# Patient Record
Sex: Female | Born: 1950 | Race: White | Hispanic: No | Marital: Married | Smoking: Never smoker
Health system: Southern US, Community
[De-identification: ages and names within clinical notes are randomized; demographics above are authoritative.]

## PROBLEM LIST (undated history)

## (undated) DIAGNOSIS — I2510 Atherosclerotic heart disease of native coronary artery without angina pectoris: Secondary | ICD-10-CM

## (undated) DIAGNOSIS — Q2112 Patent foramen ovale: Secondary | ICD-10-CM

## (undated) DIAGNOSIS — I340 Nonrheumatic mitral (valve) insufficiency: Secondary | ICD-10-CM

## (undated) DIAGNOSIS — M810 Age-related osteoporosis without current pathological fracture: Secondary | ICD-10-CM

## (undated) DIAGNOSIS — Q211 Atrial septal defect: Secondary | ICD-10-CM

## (undated) DIAGNOSIS — I10 Essential (primary) hypertension: Secondary | ICD-10-CM

## (undated) DIAGNOSIS — C4490 Unspecified malignant neoplasm of skin, unspecified: Secondary | ICD-10-CM

## (undated) HISTORY — DX: Age-related osteoporosis without current pathological fracture: M81.0

## (undated) HISTORY — DX: Atherosclerotic heart disease of native coronary artery without angina pectoris: I25.10

## (undated) HISTORY — PX: OTHER SURGICAL HISTORY: SHX169

## (undated) HISTORY — DX: Essential (primary) hypertension: I10

## (undated) HISTORY — DX: Patent foramen ovale: Q21.12

## (undated) HISTORY — DX: Nonrheumatic mitral (valve) insufficiency: I34.0

## (undated) HISTORY — DX: Atrial septal defect: Q21.1

---

## 1998-08-08 ENCOUNTER — Other Ambulatory Visit: Admission: RE | Admit: 1998-08-08 | Discharge: 1998-08-08 | Payer: Self-pay | Admitting: *Deleted

## 1999-08-29 ENCOUNTER — Other Ambulatory Visit: Admission: RE | Admit: 1999-08-29 | Discharge: 1999-08-29 | Payer: Self-pay | Admitting: *Deleted

## 1999-09-10 ENCOUNTER — Encounter: Payer: Self-pay | Admitting: *Deleted

## 1999-09-10 ENCOUNTER — Encounter: Admission: RE | Admit: 1999-09-10 | Discharge: 1999-09-10 | Payer: Self-pay | Admitting: *Deleted

## 2000-02-18 ENCOUNTER — Other Ambulatory Visit: Admission: RE | Admit: 2000-02-18 | Discharge: 2000-02-18 | Payer: Self-pay | Admitting: *Deleted

## 2000-02-19 ENCOUNTER — Other Ambulatory Visit: Admission: RE | Admit: 2000-02-19 | Discharge: 2000-02-19 | Payer: Self-pay | Admitting: *Deleted

## 2000-02-19 ENCOUNTER — Encounter (INDEPENDENT_AMBULATORY_CARE_PROVIDER_SITE_OTHER): Payer: Self-pay

## 2000-08-24 DIAGNOSIS — C4490 Unspecified malignant neoplasm of skin, unspecified: Secondary | ICD-10-CM

## 2000-08-24 HISTORY — DX: Unspecified malignant neoplasm of skin, unspecified: C44.90

## 2000-09-06 ENCOUNTER — Other Ambulatory Visit: Admission: RE | Admit: 2000-09-06 | Discharge: 2000-09-06 | Payer: Self-pay | Admitting: *Deleted

## 2000-09-10 ENCOUNTER — Encounter: Payer: Self-pay | Admitting: *Deleted

## 2000-09-10 ENCOUNTER — Encounter: Admission: RE | Admit: 2000-09-10 | Discharge: 2000-09-10 | Payer: Self-pay | Admitting: *Deleted

## 2001-02-02 ENCOUNTER — Ambulatory Visit (HOSPITAL_BASED_OUTPATIENT_CLINIC_OR_DEPARTMENT_OTHER): Admission: RE | Admit: 2001-02-02 | Discharge: 2001-02-02 | Payer: Self-pay | Admitting: Plastic Surgery

## 2001-03-01 ENCOUNTER — Ambulatory Visit (HOSPITAL_COMMUNITY): Admission: RE | Admit: 2001-03-01 | Discharge: 2001-03-01 | Payer: Self-pay | Admitting: Gastroenterology

## 2001-09-13 ENCOUNTER — Encounter: Payer: Self-pay | Admitting: *Deleted

## 2001-09-13 ENCOUNTER — Encounter: Admission: RE | Admit: 2001-09-13 | Discharge: 2001-09-13 | Payer: Self-pay | Admitting: *Deleted

## 2001-09-23 ENCOUNTER — Other Ambulatory Visit: Admission: RE | Admit: 2001-09-23 | Discharge: 2001-09-23 | Payer: Self-pay | Admitting: *Deleted

## 2002-09-15 ENCOUNTER — Encounter: Payer: Self-pay | Admitting: *Deleted

## 2002-09-15 ENCOUNTER — Encounter: Admission: RE | Admit: 2002-09-15 | Discharge: 2002-09-15 | Payer: Self-pay | Admitting: *Deleted

## 2002-11-14 ENCOUNTER — Other Ambulatory Visit: Admission: RE | Admit: 2002-11-14 | Discharge: 2002-11-14 | Payer: Self-pay | Admitting: Obstetrics and Gynecology

## 2003-01-03 ENCOUNTER — Ambulatory Visit (HOSPITAL_COMMUNITY): Admission: RE | Admit: 2003-01-03 | Discharge: 2003-01-03 | Payer: Self-pay | Admitting: Gastroenterology

## 2003-09-18 ENCOUNTER — Encounter: Admission: RE | Admit: 2003-09-18 | Discharge: 2003-09-18 | Payer: Self-pay | Admitting: Obstetrics and Gynecology

## 2003-11-21 ENCOUNTER — Other Ambulatory Visit: Admission: RE | Admit: 2003-11-21 | Discharge: 2003-11-21 | Payer: Self-pay | Admitting: Obstetrics and Gynecology

## 2003-12-11 ENCOUNTER — Encounter: Admission: RE | Admit: 2003-12-11 | Discharge: 2003-12-11 | Payer: Self-pay | Admitting: Obstetrics and Gynecology

## 2004-01-07 ENCOUNTER — Encounter: Admission: RE | Admit: 2004-01-07 | Discharge: 2004-01-07 | Payer: Self-pay | Admitting: Internal Medicine

## 2005-02-02 ENCOUNTER — Encounter: Admission: RE | Admit: 2005-02-02 | Discharge: 2005-02-02 | Payer: Self-pay | Admitting: Obstetrics and Gynecology

## 2005-05-10 ENCOUNTER — Emergency Department (HOSPITAL_COMMUNITY): Admission: EM | Admit: 2005-05-10 | Discharge: 2005-05-10 | Payer: Self-pay | Admitting: Emergency Medicine

## 2005-07-10 ENCOUNTER — Encounter: Admission: RE | Admit: 2005-07-10 | Discharge: 2005-07-10 | Payer: Self-pay | Admitting: Obstetrics and Gynecology

## 2006-09-22 ENCOUNTER — Encounter: Admission: RE | Admit: 2006-09-22 | Discharge: 2006-09-22 | Payer: Self-pay | Admitting: Internal Medicine

## 2006-09-29 ENCOUNTER — Encounter: Admission: RE | Admit: 2006-09-29 | Discharge: 2006-09-29 | Payer: Self-pay | Admitting: Obstetrics and Gynecology

## 2007-03-01 ENCOUNTER — Encounter: Admission: RE | Admit: 2007-03-01 | Discharge: 2007-03-01 | Payer: Self-pay | Admitting: Obstetrics and Gynecology

## 2007-04-12 ENCOUNTER — Emergency Department (HOSPITAL_COMMUNITY): Admission: EM | Admit: 2007-04-12 | Discharge: 2007-04-12 | Payer: Self-pay | Admitting: Emergency Medicine

## 2007-12-27 ENCOUNTER — Encounter: Admission: RE | Admit: 2007-12-27 | Discharge: 2007-12-27 | Payer: Self-pay | Admitting: Obstetrics and Gynecology

## 2008-05-10 ENCOUNTER — Encounter: Admission: RE | Admit: 2008-05-10 | Discharge: 2008-05-10 | Payer: Self-pay | Admitting: Obstetrics and Gynecology

## 2009-02-05 ENCOUNTER — Encounter: Admission: RE | Admit: 2009-02-05 | Discharge: 2009-02-05 | Payer: Self-pay | Admitting: Obstetrics and Gynecology

## 2010-09-13 ENCOUNTER — Emergency Department (HOSPITAL_COMMUNITY)
Admission: EM | Admit: 2010-09-13 | Discharge: 2010-09-13 | Payer: Self-pay | Source: Home / Self Care | Admitting: Emergency Medicine

## 2010-09-16 LAB — CBC
HCT: 35.4 % — ABNORMAL LOW (ref 36.0–46.0)
Hemoglobin: 12.1 g/dL (ref 12.0–15.0)
MCH: 30.9 pg (ref 26.0–34.0)
MCHC: 34.2 g/dL (ref 30.0–36.0)
MCV: 90.3 fL (ref 78.0–100.0)
Platelets: 247 10*3/uL (ref 150–400)
RDW: 13.2 % (ref 11.5–15.5)

## 2010-09-16 LAB — COMPREHENSIVE METABOLIC PANEL
ALT: 17 U/L (ref 0–35)
Albumin: 3.6 g/dL (ref 3.5–5.2)
BUN: 19 mg/dL (ref 6–23)
GFR calc Af Amer: >60 mL/min (ref >60)
GFR calc non Af Amer: >60 mL/min (ref >60)
Potassium: 3.8 mEq/L (ref 3.5–5.1)
Sodium: 136 mEq/L (ref 135–145)
Total Protein: 7 g/dL (ref 6.0–8.3)

## 2010-09-16 LAB — URINALYSIS, ROUTINE W REFLEX MICROSCOPIC
Ketones, ur: NEGATIVE mg/dL
Nitrite: NEGATIVE
Protein, ur: NEGATIVE mg/dL
Urine Glucose, Fasting: NEGATIVE mg/dL

## 2010-09-16 LAB — DIFFERENTIAL
Eosinophils Relative: 1 % (ref 0–5)
Lymphocytes Relative: 32 % (ref 12–46)
Neutro Abs: 2.7 10*3/uL (ref 1.7–7.7)
Neutrophils Relative %: 59 % (ref 43–77)

## 2011-01-09 NOTE — Op Note (Signed)
[place]. [facility]  Patient:    Patricia [name], Patricia [name]                        MRN: [id number] Proc. Date: 02/02/01 Adm. Date:  [id number] Attending:  Loura Halt Ii CC:         Hope M. Danella Deis, M.D.   Operative Report  PREOPERATIVE DIAGNOSIS:  A 9 mm basal cell carcinoma, left lateral neck.  POSTOPERATIVE DIAGNOSIS:  A 9 mm basal cell carcinoma, left lateral neck.  OPERATION/PROCEDURE:  Excision basal cell carcinoma, left lateral neck.  SURGEON:  Alfredia Ferguson, M.D.  ANESTHESIA:  2% Xylocaine, 1:100,000 epinephrine.  INDICATIONS FOR SURGERY:  This is a 60 year old woman with a biopsy-proven basal cell carcinoma of the left lateral neck.  She has a 9 mm residual rea of scar.  The previous margins were not clear.  The patient comes in today for excision of the scar and clearing of the margins.  She understands the risk of unsightly scarring.  She understands the risk of positive margins.  In spite of that, she wishes to proceed with the operation.  DESCRIPTION OF PROCEDURE:  Skin marks were placed in an elliptical fashion around the lesion with 2 mm margins.  Local anesthesia was infiltrated and the area was prepped and draped in the sterile fashion.  After waiting approximately seven minutes, an elliptical excision of the lesion down to the level of the subcutaneous tissue was carried out.  The lesion was passed off to pathology.  Wound edges were undermined for a distance of 2-3 mm in all directions.  Hemostasis was accomplished using cautery.  The wound was closed by approximating the dermis using interrupted 5-0 Vicryl suture.  Skin was united using a running 6-0 nylon.  Light dressing was applied.  The patient was discharged to home in satisfactory condition. DD:  02/02/01 TD:  02/02/01 Job: [id number]

## 2011-01-09 NOTE — Op Note (Signed)
[name], [name]                           ACCOUNT NO.:  [id number]   MEDICAL RECORD NO.:  [id number]                   PATIENT TYPE:  AMB   LOCATION:  ENDO                                 FACILITY:  [facility]   PHYSICIAN:  Anselmo Rod, M.D.               DATE OF BIRTH:  1951/05/27   DATE OF PROCEDURE:  01/03/2003  DATE OF DISCHARGE:                                 OPERATIVE REPORT   PROCEDURE PERFORMED:  Colonoscopy.   ENDOSCOPIST:  Charna Elizabeth, M.D.   INSTRUMENT USED:  Olympus video colonoscope.   INDICATIONS FOR PROCEDURE:  Change in bowel habits with severe rectal  pressure in a 60 year old white female with history of mitral valve  prolapse.  Rule out colonic polyps, masses, etc.   PREPROCEDURE PREPARATION:  Informed consent was procured from the patient.  The patient was fasted for eight hours prior to the procedure and prepped  with a bottle of magnesium citrate and a gallon of GoLYTELY the night prior  to the procedure.  The patient was given Cipro 400 mg intravenous for a  history of mitral valve prolapse prophylaxis during colonoscopy.   PREPROCEDURE PHYSICAL:  The patient had stable vital signs.  Neck supple.  Chest clear to auscultation.  S1 and S2 regular.  Abdomen soft with normal  bowel sounds.   DESCRIPTION OF PROCEDURE:  The patient was placed in left lateral decubitus  position and sedated with 70 mg of Demerol and 7 mg of Versed intravenously.  Once the patient was adequately sedated and maintained on low flow oxygen  and continuous cardiac monitoring, the Olympus video colonoscope was  advanced from the rectum to the cecum and terminal ileum without difficulty.  No masses, polyps, erosions, ulcerations or diverticula were seen.  The  terminal ileum appeared healthy.  The appendicular orifice and ileocecal  valve were clearly visualized and photographed.  Retroflexion in the rectum  revealed no acute abnormalities.   IMPRESSION:  Normal colonoscopy to  the terminal ileum.                  RECOMMENDATIONS:  1. A high fiber diet with liberal fluid intake has been advised.  2. Repeat colorectal cancer screening is recommended in the next five years     unless the patient develops any abnormal symptoms in the interim.  3. Outpatient follow-up in the next two weeks or earlier if need be.                                              Anselmo Rod, M.D.  JNM/MEDQ  D:  01/03/2003  T:  01/04/2003  Job:  [id number]  cc:   Ike Bene, M.D.  [address]  [place]  [place] [address]  Fax: [phone]

## 2011-01-09 NOTE — Procedures (Signed)
[place]. [facility]  Patient:    Patricia [name], Patricia [name]                        MRN: [id number] Proc. Date: 03/01/01 Adm. Date:  [id number] Disc. Date: [id number] Attending:  Loura Halt Ii                           Procedure Report  DATE OF BIRTH:  09-Jun-1951  REFERRING PHYSICIAN:  Jenel Lucks, M.D.  PROCEDURE PERFORMED:  Colonoscopy.  ENDOSCOPIST:  Anselmo Rod, M.D.  INSTRUMENT USED:  Olympus pediatric video colonoscope.  INDICATIONS FOR PROCEDURE:  Trace guaiac positive stools in a 60 year old white female rule out colonic polyps, masses, hemorrhoids, etc.  PREPROCEDURE PREPARATION:  Informed consent was procured from the patient. The patient was fasted for eight hours prior to the procedure and prepped with a bottle of magnesium citrate and a gallon of NuLytely the night prior to the procedure.  PREPROCEDURE PHYSICAL:  The patient had stable vital signs.  Neck supple. Chest clear to auscultation.  S1, S2 regular.  Abdomen soft with normal abdominal bowel sounds.  DESCRIPTION OF PROCEDURE:  The patient was placed in the left lateral decubitus position and sedated with 50 mg of Demerol and 6.5 mg of Versed intravenously.  Once the patient was adequately sedated and maintained on low-flow oxygen and continuous cardiac monitoring, the Olympus video colonoscope was advanced from the rectum to the cecum without difficulty.  The entire colonic mucosa appeared healthy with normal vascular pattern, no erosions, ulcerations, masses or polyps were seen.  The appendicular orifice and the ileocecal valve were clearly visualized and photographed and showed no evidence of diverticulosis.  IMPRESSION:  Healthy-appearing colon.  RECOMMENDATIONS:  Repeat guaiacs on outpatient basis.  Further recommendations thereafter.DD:  03/01/01 TD:  03/01/01 Job: [id number]

## 2011-06-05 LAB — LIPASE, BLOOD: Lipase: 56

## 2011-06-05 LAB — URINALYSIS, ROUTINE W REFLEX MICROSCOPIC
Hgb urine dipstick: NEGATIVE
Ketones, ur: NEGATIVE
Specific Gravity, Urine: 1.008
Urobilinogen, UA: 0.2
pH: 8

## 2011-06-05 LAB — DIFFERENTIAL
Basophils Absolute: 0
Basophils Relative: 1
Eosinophils Relative: 7 — ABNORMAL HIGH
Monocytes Absolute: 0.5
Monocytes Relative: 10

## 2011-06-05 LAB — CBC
Hemoglobin: 13.2
MCHC: 34
MCV: 92
RBC: 4.21

## 2011-06-05 LAB — I-STAT 8, (EC8 V) (CONVERTED LAB)
Bicarbonate: 27 — ABNORMAL HIGH
HCT: 43
Hemoglobin: 14.6
Potassium: 3.7
Sodium: 139
pCO2, Ven: 32.2 — ABNORMAL LOW
pH, Ven: 7.531 — ABNORMAL HIGH

## 2011-06-05 LAB — HEPATIC FUNCTION PANEL
Alkaline Phosphatase: 62
Total Bilirubin: 0.7

## 2011-06-05 LAB — POCT CARDIAC MARKERS: Operator id: 270651

## 2011-07-27 ENCOUNTER — Other Ambulatory Visit: Payer: Self-pay | Admitting: Obstetrics and Gynecology

## 2011-07-27 DIAGNOSIS — R928 Other abnormal and inconclusive findings on diagnostic imaging of breast: Secondary | ICD-10-CM

## 2011-08-10 ENCOUNTER — Other Ambulatory Visit: Payer: Self-pay | Admitting: Obstetrics and Gynecology

## 2011-08-10 ENCOUNTER — Ambulatory Visit
Admission: RE | Admit: 2011-08-10 | Discharge: 2011-08-10 | Disposition: A | Discharge status: Home / Self Care | Payer: BC Managed Care – PPO | Source: Ambulatory Visit | Attending: Obstetrics and Gynecology | Admitting: Obstetrics and Gynecology

## 2011-08-10 DIAGNOSIS — R928 Other abnormal and inconclusive findings on diagnostic imaging of breast: Secondary | ICD-10-CM

## 2013-08-31 ENCOUNTER — Encounter: Payer: Self-pay | Admitting: General Surgery

## 2013-08-31 DIAGNOSIS — I341 Nonrheumatic mitral (valve) prolapse: Secondary | ICD-10-CM

## 2013-08-31 DIAGNOSIS — I493 Ventricular premature depolarization: Secondary | ICD-10-CM | POA: Insufficient documentation

## 2013-08-31 DIAGNOSIS — R002 Palpitations: Secondary | ICD-10-CM

## 2013-09-21 ENCOUNTER — Ambulatory Visit (INDEPENDENT_AMBULATORY_CARE_PROVIDER_SITE_OTHER): Payer: BC Managed Care – PPO | Admitting: Cardiology

## 2013-09-21 ENCOUNTER — Encounter: Payer: Self-pay | Admitting: Cardiology

## 2013-09-21 ENCOUNTER — Encounter (INDEPENDENT_AMBULATORY_CARE_PROVIDER_SITE_OTHER): Payer: Self-pay

## 2013-09-21 VITALS — BP 126/88 | HR 61 | Ht 67.0 in | Wt 136.4 lb

## 2013-09-21 DIAGNOSIS — I341 Nonrheumatic mitral (valve) prolapse: Secondary | ICD-10-CM

## 2013-09-21 DIAGNOSIS — I059 Rheumatic mitral valve disease, unspecified: Secondary | ICD-10-CM

## 2013-09-21 DIAGNOSIS — R002 Palpitations: Secondary | ICD-10-CM

## 2013-09-21 DIAGNOSIS — I4949 Other premature depolarization: Secondary | ICD-10-CM

## 2013-09-21 DIAGNOSIS — I493 Ventricular premature depolarization: Secondary | ICD-10-CM

## 2013-09-21 NOTE — Progress Notes (Signed)
  [place], [place] [place], [place]  [address] Phone: [phone] Fax:  [phone]  Date:  09/21/2013   ID:  Kimiah Hibner, DOB [birth year], MRN [id number]  PCP:  Kandice Hams, MD  Cardiologist:  Fransico Him, MD  [place]   History of Present Illness: Patricia [name] is a 63 y.o. female with a history of PFO, MVP and PVC's who presents back today for followup.  She is doing well.  She denies any chest pain, SOB, DOE, LE edema, dizziness, or syncope.  She occasionally has some palpitations but they are significantly improved from when I saw her last.   Wt Readings from Last 3 Encounters:  09/21/13 136 lb 6.4 oz (61.871 kg)     Past Medical History  Diagnosis Date  . Patent foramen ovale   . History of mitral valve prolapse     No MVP noted on echo 2011  . Osteoporosis     tx w fosomax X5 years and boniva X2 currently not on treatment  . Cancer 2002    skin cancer basal cell on neck    Current Outpatient Prescriptions  Medication Sig Dispense Refill  . Biotin 5 MG CAPS Take 1 capsule by mouth daily.      . Calcium Carbonate-Vitamin D (CALCIUM 600+D) 600-400 MG-UNIT per tablet Take 1 tablet by mouth daily.      . cholecalciferol (VITAMIN D) 1000 UNITS tablet Take 1,000 Units by mouth daily.      . Cyanocobalamin (VITAMIN B-12 CR PO) Take 50 mcg by mouth daily.      . Multiple Vitamin (MULTIVITAMIN) tablet Take 1 tablet by mouth daily.      . Omega 3 1000 MG CAPS Take 1,000 mg by mouth daily.       No current facility-administered medications for this visit.    Allergies:   No Known Allergies  Social History:  The patient  reports that she has never smoked. She does not have any smokeless tobacco history on file.   Family History:  The patient's family history is not on file.   ROS:  Please see the history of present illness.      All other systems reviewed and negative.   PHYSICAL EXAM: VS:  Ht 5\' 7"  (1.702 m)  Wt 136 lb 6.4 oz (61.871 kg)  BMI 21.36 kg/m2 Well  nourished, well developed, in no acute distress HEENT: normal Neck: no JVD Cardiac:  normal S1, S2; RRR; no murmur Lungs:  clear to auscultation bilaterally, no wheezing, rhonchi or rales Abd: soft, nontender, no hepatomegaly Ext: no edema Skin: warm and dry Neuro:  CNs 2-12 intact, no focal abnormalities noted  EKG:   NSR with IRBBB   ASSESSMENT AND PLAN:  1. PFO by echo  2. PVC's - asymptomatic 3. HTN - controlled 4. History of MVP but none noted on echo 2014 just mild MR   Followup with me in 1 year  Signed, Fransico Him, MD 09/21/2013 2:38 PM

## 2013-09-21 NOTE — Patient Instructions (Signed)
Your physician recommends that you continue on your current medications as directed. Please refer to the Current Medication list given to you today. Your physician wants you to follow-up in: 12 months with Dr. Turner.  You will receive a reminder letter in the mail two months in advance. If you don't receive a letter, please call our office to schedule the follow-up appointment.  

## 2014-07-31 ENCOUNTER — Encounter: Payer: Self-pay | Admitting: Cardiology

## 2014-09-25 ENCOUNTER — Ambulatory Visit (INDEPENDENT_AMBULATORY_CARE_PROVIDER_SITE_OTHER): Payer: BLUE CROSS/BLUE SHIELD | Admitting: Cardiology

## 2014-09-25 ENCOUNTER — Encounter: Payer: Self-pay | Admitting: Cardiology

## 2014-09-25 VITALS — BP 130/82 | HR 70 | Ht 67.0 in | Wt 138.0 lb

## 2014-09-25 DIAGNOSIS — I493 Ventricular premature depolarization: Secondary | ICD-10-CM

## 2014-09-25 DIAGNOSIS — I10 Essential (primary) hypertension: Secondary | ICD-10-CM

## 2014-09-25 DIAGNOSIS — I340 Nonrheumatic mitral (valve) insufficiency: Secondary | ICD-10-CM

## 2014-09-25 HISTORY — DX: Essential (primary) hypertension: I10

## 2014-09-25 NOTE — Progress Notes (Signed)
Cardiology Office Note   Date:  09/25/2014   ID:  Patricia [name], DOB 07-May-1951, MRN [id number]  PCP:  Kandice Hams, MD  Cardiologist:   Sueanne Margarita, MD   Chief Complaint  Patient presents with  . Irregular Heart Beat  . Hypertension      History of Present Illness: Patricia [name] is a 64 y.o. female with a history of PFO, MVP and PVC's who presents back today for followup. She is doing well. She denies any chest pain, SOB, DOE, LE edema, dizziness, or syncope. She occasionally has some palpitations but they are significantly improved from when I saw her last.     Past Medical History  Diagnosis Date  . Patent foramen ovale   . Osteoporosis     tx w fosomax X5 years and boniva X2 currently not on treatment  . Cancer 2002    skin cancer basal cell on neck  . MR (mitral regurgitation)     mild by echo 2014 with no evidence of MVP    Past Surgical History  Procedure Laterality Date  . None       Current Outpatient Prescriptions  Medication Sig Dispense Refill  . Biotin 5 MG CAPS Take 1 capsule by mouth daily.    . Calcium Carbonate-Vitamin D (CALCIUM 600+D) 600-400 MG-UNIT per tablet Take 1 tablet by mouth daily.    . cholecalciferol (VITAMIN D) 1000 UNITS tablet Take 1,000 Units by mouth daily.    . Cyanocobalamin (VITAMIN B-12 CR PO) Take 50 mcg by mouth daily.    . Multiple Vitamin (MULTIVITAMIN) tablet Take 1 tablet by mouth daily.    . Omega 3 1000 MG CAPS Take 1,000 mg by mouth daily.     No current facility-administered medications for this visit.    Allergies:   Review of patient's allergies indicates no known allergies.    Social History:  The patient  reports that she has never smoked. She does not have any smokeless tobacco history on file. She reports that she does not drink alcohol or use illicit drugs.   Family History:  The patient's family history includes COPD in her mother; Heart attack in her father.    ROS:  Please see the history  of present illness.   Otherwise, review of systems are positive for none.   All other systems are reviewed and negative.    PHYSICAL EXAM: VS:  BP 130/82 mmHg  Pulse 70  Ht 5\' 7"  (1.702 m)  Wt 138 lb (62.596 kg)  BMI 21.61 kg/m2 , BMI Body mass index is 21.61 kg/(m^2). GEN: Well nourished, well developed, in no acute distress HEENT: normal Neck: no JVD, carotid bruits, or masses Cardiac: RRR; no murmurs, rubs, or gallops,no edema  Respiratory:  clear to auscultation bilaterally, normal work of breathing GI: soft, nontender, nondistended, + BS MS: no deformity or atrophy Skin: warm and dry, no rash Neuro:  Strength and sensation are intact Psych: euthymic mood, full affect   EKG:  EKG is ordered today. The ekg ordered today demonstrates NSR with IRBBB, cannot rule out inferior infarct age undetermiend   Recent Labs: No results found for requested labs within last 365 days.    Lipid Panel No results found for: CHOL, TRIG, HDL, CHOLHDL, VLDL, LDLCALC, LDLDIRECT    Wt Readings from Last 3 Encounters:  09/25/14 138 lb (62.596 kg)  09/21/13 136 lb 6.4 oz (61.871 kg)     ASSESSMENT AND PLAN:  1. PFO by prior echo but  not evident on echo 2014 2. PVC's - asymptomatic 3. HTN - controlled of meds 4. History of MVP but none noted on echo 2014 just mild MR   Current medicines are reviewed at length with the patient today.  The patient does not have concerns regarding medicines.  The following changes have been made:  no change    Disposition:   FU with me in 1 year   Signed, Sueanne Margarita, MD  09/25/2014 11:16 AM    [facility] [place], [place], [place]  [address] Phone: [phone]; Fax: [phone]

## 2014-09-25 NOTE — Patient Instructions (Signed)
Your physician recommends that you continue on your current medications as directed. Please refer to the Current Medication list given to you today.    Your physician wants you to follow-up in: [place] will receive a reminder letter in the mail two months in advance. If you don't receive a letter, please call our office to schedule the follow-up appointment.

## 2014-09-25 NOTE — Addendum Note (Signed)
Addended by: Fransico Him R on: 09/25/2014 10:02 PM   Modules accepted: Miquel Dunn

## 2014-09-25 NOTE — Addendum Note (Signed)
Addended by: Claude Manges on: 09/25/2014 04:17 PM   Modules accepted: Miquel Dunn

## 2015-12-30 DIAGNOSIS — Z01419 Encounter for gynecological examination (general) (routine) without abnormal findings: Secondary | ICD-10-CM | POA: Diagnosis not present

## 2015-12-30 DIAGNOSIS — Z6820 Body mass index (BMI) 20.0-20.9, adult: Secondary | ICD-10-CM | POA: Diagnosis not present

## 2015-12-30 DIAGNOSIS — Z1231 Encounter for screening mammogram for malignant neoplasm of breast: Secondary | ICD-10-CM | POA: Diagnosis not present

## 2016-07-10 ENCOUNTER — Telehealth: Payer: Self-pay | Admitting: Cardiology

## 2016-07-10 DIAGNOSIS — M816 Localized osteoporosis [Lequesne]: Secondary | ICD-10-CM | POA: Diagnosis not present

## 2016-07-10 DIAGNOSIS — Z23 Encounter for immunization: Secondary | ICD-10-CM | POA: Diagnosis not present

## 2016-07-10 DIAGNOSIS — R030 Elevated blood-pressure reading, without diagnosis of hypertension: Secondary | ICD-10-CM | POA: Diagnosis not present

## 2016-07-10 DIAGNOSIS — Z0000 Encounter for general adult medical examination without abnormal findings: Secondary | ICD-10-CM | POA: Diagnosis not present

## 2016-07-10 DIAGNOSIS — E7800 Pure hypercholesterolemia, unspecified: Secondary | ICD-10-CM | POA: Diagnosis not present

## 2016-07-10 NOTE — Telephone Encounter (Signed)
NEw Message  Pt call requesting to speak with RN about scheduling a f/u appt. Pt states she did not receive a recall letter and would like to know if she needs to make a f/u up appt. Please call back tod discuss

## 2016-07-10 NOTE — Telephone Encounter (Signed)
Spoke with patient who states she is getting ready to see her PCP and she realized she had not seen Dr. Radford Pax since 2016.  She states she did not receive a recall letter and wanted to determine when follow-up should have been scheduled.  I advised that in 2/16, Dr. Radford Pax planned to see her 1 year later.  I apologized that she did not receive a reminder and offered to make her an appointment.  She states she is in the car now and will call back to schedule on Monday. She denies complaints. She thanked me for the call.

## 2016-08-27 DIAGNOSIS — H2513 Age-related nuclear cataract, bilateral: Secondary | ICD-10-CM | POA: Diagnosis not present

## 2016-08-27 DIAGNOSIS — H1851 Endothelial corneal dystrophy: Secondary | ICD-10-CM | POA: Diagnosis not present

## 2016-09-08 DIAGNOSIS — L814 Other melanin hyperpigmentation: Secondary | ICD-10-CM | POA: Diagnosis not present

## 2016-09-08 DIAGNOSIS — Z85828 Personal history of other malignant neoplasm of skin: Secondary | ICD-10-CM | POA: Diagnosis not present

## 2016-09-08 DIAGNOSIS — D225 Melanocytic nevi of trunk: Secondary | ICD-10-CM | POA: Diagnosis not present

## 2016-10-26 DIAGNOSIS — Z8262 Family history of osteoporosis: Secondary | ICD-10-CM | POA: Diagnosis not present

## 2016-10-26 DIAGNOSIS — M816 Localized osteoporosis [Lequesne]: Secondary | ICD-10-CM | POA: Diagnosis not present

## 2016-12-31 DIAGNOSIS — Z1231 Encounter for screening mammogram for malignant neoplasm of breast: Secondary | ICD-10-CM | POA: Diagnosis not present

## 2016-12-31 DIAGNOSIS — Z6821 Body mass index (BMI) 21.0-21.9, adult: Secondary | ICD-10-CM | POA: Diagnosis not present

## 2016-12-31 DIAGNOSIS — Z01419 Encounter for gynecological examination (general) (routine) without abnormal findings: Secondary | ICD-10-CM | POA: Diagnosis not present

## 2017-01-04 ENCOUNTER — Other Ambulatory Visit: Payer: Self-pay | Admitting: Obstetrics and Gynecology

## 2017-01-04 DIAGNOSIS — R928 Other abnormal and inconclusive findings on diagnostic imaging of breast: Secondary | ICD-10-CM

## 2017-01-06 ENCOUNTER — Ambulatory Visit
Admission: RE | Admit: 2017-01-06 | Discharge: 2017-01-06 | Disposition: A | Discharge status: Home / Self Care | Payer: Medicare Other | Source: Ambulatory Visit | Attending: Obstetrics and Gynecology | Admitting: Obstetrics and Gynecology

## 2017-01-06 DIAGNOSIS — R928 Other abnormal and inconclusive findings on diagnostic imaging of breast: Secondary | ICD-10-CM | POA: Diagnosis not present

## 2017-01-06 HISTORY — DX: Unspecified malignant neoplasm of skin, unspecified: C44.90

## 2017-03-15 DIAGNOSIS — R87615 Unsatisfactory cytologic smear of cervix: Secondary | ICD-10-CM | POA: Diagnosis not present

## 2017-04-16 ENCOUNTER — Encounter: Payer: Self-pay | Admitting: Podiatry

## 2017-04-16 ENCOUNTER — Ambulatory Visit (INDEPENDENT_AMBULATORY_CARE_PROVIDER_SITE_OTHER): Payer: Medicare Other | Admitting: Podiatry

## 2017-04-16 ENCOUNTER — Ambulatory Visit (INDEPENDENT_AMBULATORY_CARE_PROVIDER_SITE_OTHER): Payer: Medicare Other

## 2017-04-16 VITALS — BP 142/77 | HR 63 | Resp 16

## 2017-04-16 DIAGNOSIS — M779 Enthesopathy, unspecified: Secondary | ICD-10-CM

## 2017-04-16 DIAGNOSIS — M7752 Other enthesopathy of left foot: Secondary | ICD-10-CM | POA: Diagnosis not present

## 2017-04-16 DIAGNOSIS — Q828 Other specified congenital malformations of skin: Secondary | ICD-10-CM | POA: Diagnosis not present

## 2017-04-16 DIAGNOSIS — M2041 Other hammer toe(s) (acquired), right foot: Secondary | ICD-10-CM

## 2017-04-16 DIAGNOSIS — M2042 Other hammer toe(s) (acquired), left foot: Secondary | ICD-10-CM

## 2017-04-16 MED ORDER — TRIAMCINOLONE ACETONIDE 10 MG/ML IJ SUSP
10.0000 mg | Freq: Once | INTRAMUSCULAR | Status: AC
  Administered 2017-04-16: 10 mg

## 2017-04-16 NOTE — Progress Notes (Signed)
   Subjective:    Patient ID: Patricia [name], female    DOB: December 10, 1950, 66 y.o.   MRN: [id number]  HPI Chief Complaint  Patient presents with  . Foot Pain    5th MPJ left, 1st toe (interdigital) left, 5th toe (tip) right - Multiple pain and callused areas, 5th MPJ left x 5 years is the most irritated, gets red sometimes, tried using pumice stone      Review of Systems  All other systems reviewed and are negative.      Objective:   Physical Exam        Assessment & Plan:

## 2017-04-19 NOTE — Progress Notes (Signed)
Subjective:    Patient ID: Patricia [name], female   DOB: 66 y.o.   MRN: [id number]   HPI patient states she's developed a lot of pain around the fifth MPJ of her left foot and she has lesions on the bottom of both feet they get sore and a been present for a fairly long time    Review of Systems  All other systems reviewed and are negative.       Objective:  Physical Exam  Constitutional: She is oriented to person, place, and time. She appears well-developed and well-nourished.  Cardiovascular: Intact distal pulses.   Musculoskeletal: Normal range of motion.  Neurological: She is alert and oriented to person, place, and time.  Skin: Skin is warm.  Nursing note and vitals reviewed.  neurovascular status intact muscle strength adequate range of motion within normal limits with patient noted to have quite a bit of discomfort in the fifth MPJ left with fluid buildup around the joint surface with lesion formation. 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 Patient will be seen back as needed  X-rays indicate there is moderate pressure but no indications of stress fracture with mild arthritis and no other pathology

## 2017-07-23 DIAGNOSIS — Z23 Encounter for immunization: Secondary | ICD-10-CM | POA: Diagnosis not present

## 2017-07-23 DIAGNOSIS — M816 Localized osteoporosis [Lequesne]: Secondary | ICD-10-CM | POA: Diagnosis not present

## 2017-07-23 DIAGNOSIS — Z0000 Encounter for general adult medical examination without abnormal findings: Secondary | ICD-10-CM | POA: Diagnosis not present

## 2017-07-23 DIAGNOSIS — Z1389 Encounter for screening for other disorder: Secondary | ICD-10-CM | POA: Diagnosis not present

## 2017-07-23 DIAGNOSIS — E7800 Pure hypercholesterolemia, unspecified: Secondary | ICD-10-CM | POA: Diagnosis not present

## 2017-07-23 DIAGNOSIS — R5383 Other fatigue: Secondary | ICD-10-CM | POA: Diagnosis not present

## 2017-09-02 DIAGNOSIS — H2513 Age-related nuclear cataract, bilateral: Secondary | ICD-10-CM | POA: Diagnosis not present

## 2017-09-02 DIAGNOSIS — H1851 Endothelial corneal dystrophy: Secondary | ICD-10-CM | POA: Diagnosis not present

## 2017-09-07 DIAGNOSIS — L821 Other seborrheic keratosis: Secondary | ICD-10-CM | POA: Diagnosis not present

## 2017-09-07 DIAGNOSIS — L814 Other melanin hyperpigmentation: Secondary | ICD-10-CM | POA: Diagnosis not present

## 2017-09-07 DIAGNOSIS — D229 Melanocytic nevi, unspecified: Secondary | ICD-10-CM | POA: Diagnosis not present

## 2017-12-14 ENCOUNTER — Ambulatory Visit (INDEPENDENT_AMBULATORY_CARE_PROVIDER_SITE_OTHER): Payer: Medicare Other | Admitting: Podiatry

## 2017-12-14 DIAGNOSIS — M79672 Pain in left foot: Secondary | ICD-10-CM

## 2017-12-14 DIAGNOSIS — Q828 Other specified congenital malformations of skin: Secondary | ICD-10-CM

## 2017-12-15 NOTE — Progress Notes (Signed)
Subjective: 67 year old female presents the office today for concerns of painful corn, callus to the left foot, fifth metatarsal head area that she points to be she is also starting there is something painful in between her big toe and her second toe.  She has acute on between her toes especially wearing sneakers.  Denies any recent injury or trauma denies any swelling or redness or any drainage or pus. Denies any systemic complaints such as fevers, chills, nausea, vomiting. No acute changes since last appointment, and no other complaints at this time.   Objective: AAO x3, NAD DP/PT pulses palpable bilaterally, CRT less than 3 seconds 2 annular hyperkeratotic lesions present to the fifth metatarsal head plantarly, laterally.  Also small hyperkeratotic lesions along the IPJ of the associated hallux and second toe along the interspace.  Upon debridement of this area there is no underlying ulceration, drainage or any signs of infection present.  Prominent metatarsal heads plantarly as well as associated bony prominences to the hallux and second toe.  No open lesions or pre-ulcerative lesions.  No pain with calf compression, swelling, warmth, erythema  Assessment: Hyperkeratotic lesions left foot  Plan: -All treatment options discussed with the patient including all alternatives, risks, complications.  -Lesions were sharply debrided to any complications except for a very minimal amount of bleeding occurred metatarsal 5.  This was not a This is where from the skin was deep and the callus on the deeper portion came out.  The area was cleaned with alcohol and antibiotic ointment was applied.  Dispensed offloading pads.  Discussed shoe modifications and possibly inserts to help take pressure off the area particularly submetatarsal 5.- -Monitor for any clinical signs or symptoms of infection and directed to call the office immediately should any occur or go to the ER. -RTC prn  Trula Slade DPM

## 2017-12-31 DIAGNOSIS — J069 Acute upper respiratory infection, unspecified: Secondary | ICD-10-CM | POA: Diagnosis not present

## 2018-02-03 DIAGNOSIS — Z6820 Body mass index (BMI) 20.0-20.9, adult: Secondary | ICD-10-CM | POA: Diagnosis not present

## 2018-02-03 DIAGNOSIS — Z1231 Encounter for screening mammogram for malignant neoplasm of breast: Secondary | ICD-10-CM | POA: Diagnosis not present

## 2018-02-03 DIAGNOSIS — Z01419 Encounter for gynecological examination (general) (routine) without abnormal findings: Secondary | ICD-10-CM | POA: Diagnosis not present

## 2018-07-04 DIAGNOSIS — R05 Cough: Secondary | ICD-10-CM | POA: Diagnosis not present

## 2018-07-04 DIAGNOSIS — J209 Acute bronchitis, unspecified: Secondary | ICD-10-CM | POA: Diagnosis not present

## 2018-07-04 DIAGNOSIS — R0989 Other specified symptoms and signs involving the circulatory and respiratory systems: Secondary | ICD-10-CM | POA: Diagnosis not present

## 2018-07-06 DIAGNOSIS — R110 Nausea: Secondary | ICD-10-CM | POA: Diagnosis not present

## 2018-07-06 DIAGNOSIS — J209 Acute bronchitis, unspecified: Secondary | ICD-10-CM | POA: Diagnosis not present

## 2018-07-07 ENCOUNTER — Other Ambulatory Visit: Payer: Self-pay

## 2018-07-07 ENCOUNTER — Encounter (HOSPITAL_COMMUNITY): Payer: Self-pay | Admitting: Emergency Medicine

## 2018-07-07 ENCOUNTER — Emergency Department (HOSPITAL_COMMUNITY)
Admission: EM | Admit: 2018-07-07 | Discharge: 2018-07-07 | Disposition: A | Discharge status: Home / Self Care | Payer: Medicare Other | Attending: Emergency Medicine | Admitting: Emergency Medicine

## 2018-07-07 ENCOUNTER — Emergency Department (HOSPITAL_COMMUNITY): Payer: Medicare Other

## 2018-07-07 DIAGNOSIS — R042 Hemoptysis: Secondary | ICD-10-CM | POA: Insufficient documentation

## 2018-07-07 DIAGNOSIS — Z85828 Personal history of other malignant neoplasm of skin: Secondary | ICD-10-CM | POA: Insufficient documentation

## 2018-07-07 DIAGNOSIS — R05 Cough: Secondary | ICD-10-CM | POA: Insufficient documentation

## 2018-07-07 DIAGNOSIS — Z79899 Other long term (current) drug therapy: Secondary | ICD-10-CM | POA: Diagnosis not present

## 2018-07-07 DIAGNOSIS — R0789 Other chest pain: Secondary | ICD-10-CM | POA: Insufficient documentation

## 2018-07-07 DIAGNOSIS — R112 Nausea with vomiting, unspecified: Secondary | ICD-10-CM

## 2018-07-07 DIAGNOSIS — I10 Essential (primary) hypertension: Secondary | ICD-10-CM | POA: Diagnosis not present

## 2018-07-07 DIAGNOSIS — R059 Cough, unspecified: Secondary | ICD-10-CM

## 2018-07-07 DIAGNOSIS — R0602 Shortness of breath: Secondary | ICD-10-CM | POA: Diagnosis not present

## 2018-07-07 LAB — COMPREHENSIVE METABOLIC PANEL
ALK PHOS: 72 U/L (ref 38–126)
ALT: 18 U/L (ref 0–44)
ANION GAP: 12 (ref 5–15)
AST: 27 U/L (ref 15–41)
Albumin: 4 g/dL (ref 3.5–5.0)
BILIRUBIN TOTAL: 0.8 mg/dL (ref 0.3–1.2)
BUN: 11 mg/dL (ref 8–23)
CALCIUM: 8.6 mg/dL — AB (ref 8.9–10.3)
CO2: 22 mmol/L (ref 22–32)
Chloride: 88 mmol/L — ABNORMAL LOW (ref 98–111)
Creatinine, Ser: 0.74 mg/dL (ref 0.44–1.00)
Glucose, Bld: 174 mg/dL — ABNORMAL HIGH (ref 70–99)
Potassium: 2.8 mmol/L — ABNORMAL LOW (ref 3.5–5.1)
SODIUM: 122 mmol/L — AB (ref 135–145)
TOTAL PROTEIN: 7.6 g/dL (ref 6.5–8.1)

## 2018-07-07 LAB — CBC
HCT: 40 % (ref 36.0–46.0)
Hemoglobin: 13.6 g/dL (ref 12.0–15.0)
MCH: 31.6 pg (ref 26.0–34.0)
MCHC: 34 g/dL (ref 30.0–36.0)
MCV: 92.8 fL (ref 80.0–100.0)
NRBC: 0 % (ref 0.0–0.2)
PLATELETS: 191 10*3/uL (ref 150–400)
RBC: 4.31 MIL/uL (ref 3.87–5.11)
RDW: 12.1 % (ref 11.5–15.5)
WBC: 4.4 10*3/uL (ref 4.0–10.5)

## 2018-07-07 LAB — I-STAT TROPONIN, ED: TROPONIN I, POC: 0 ng/mL (ref 0.00–0.08)

## 2018-07-07 LAB — ABO/RH: ABO/RH(D): A NEG

## 2018-07-07 LAB — D-DIMER, QUANTITATIVE: D-Dimer, Quant: 0.31 ug/mL-FEU (ref 0.00–0.50)

## 2018-07-07 LAB — TYPE AND SCREEN
ABO/RH(D): A NEG
Antibody Screen: NEGATIVE

## 2018-07-07 LAB — LIPASE, BLOOD: Lipase: 30 U/L (ref 11–51)

## 2018-07-07 MED ORDER — ONDANSETRON HCL 4 MG/2ML IJ SOLN
4.0000 mg | Freq: Once | INTRAMUSCULAR | Status: AC
  Administered 2018-07-07: 4 mg via INTRAVENOUS
  Filled 2018-07-07: qty 2

## 2018-07-07 MED ORDER — POTASSIUM CHLORIDE CRYS ER 20 MEQ PO TBCR
40.0000 meq | EXTENDED_RELEASE_TABLET | Freq: Once | ORAL | Status: AC
  Administered 2018-07-07: 40 meq via ORAL
  Filled 2018-07-07: qty 2

## 2018-07-07 MED ORDER — SODIUM CHLORIDE 0.9 % IV BOLUS
500.0000 mL | Freq: Once | INTRAVENOUS | Status: AC
  Administered 2018-07-07: 500 mL via INTRAVENOUS

## 2018-07-07 MED ORDER — PROMETHAZINE HCL 12.5 MG PO TABS: 12.5000 mg | ORAL_TABLET | Freq: Four times a day (QID) | ORAL | 0 refills | Status: DC | PRN

## 2018-07-07 MED ORDER — SODIUM CHLORIDE 0.9 % IV BOLUS
1000.0000 mL | Freq: Once | INTRAVENOUS | Status: AC
  Administered 2018-07-07: 1000 mL via INTRAVENOUS

## 2018-07-07 MED ORDER — PROMETHAZINE HCL 25 MG PO TABS
12.5000 mg | ORAL_TABLET | Freq: Once | ORAL | Status: AC
  Administered 2018-07-07: 12.5 mg via ORAL
  Filled 2018-07-07: qty 1

## 2018-07-07 NOTE — ED Provider Notes (Signed)
[facility] DEPT Provider Note   CSN: [id number] Arrival date & time: 07/07/18  6599     History   Chief Complaint Chief Complaint  Patient presents with  . Hematemesis    HPI Patricia [name] is a 67 y.o. female.  The history is provided by the patient, the spouse and medical records. No language interpreter was used.  Cough  This is a new problem. The current episode started more than 2 days ago. The problem occurs constantly. The problem has not changed since onset.The cough is productive of blood-tinged sputum. There has been no fever. The fever has been present for less than 1 day. Associated symptoms include chest pain, chills and shortness of breath. Pertinent negatives include no headaches, no rhinorrhea, no sore throat and no wheezing. Treatments tried: two antibiotics. Her past medical history is significant for bronchitis (reported). Her past medical history does not include COPD, emphysema or asthma.    Past Medical History:  Diagnosis Date  . Benign essential HTN 09/25/2014  . MR (mitral regurgitation)    mild by echo 2014 with no evidence of MVP  . Osteoporosis    tx w fosomax X5 years and boniva X2 currently not on treatment  . Patent foramen ovale   . 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 Benign essential HTN 09/25/2014  . MR (mitral regurgitation)   . Palpitations 08/31/2013  . PVC's (premature ventricular contractions) 08/31/2013    Past Surgical History:  Procedure Laterality Date  . none       OB History   None      Home Medications    Prior to Admission medications   Medication Sig Start Date End Date Taking? Authorizing Provider  Biotin 5 MG CAPS Take 1 capsule by mouth daily.    [provider]  Calcium Carbonate-Vitamin D (CALCIUM 600+D) 600-400 MG-UNIT per tablet Take 1 tablet by mouth daily.    [provider]    cholecalciferol (VITAMIN D) 1000 UNITS tablet Take 1,000 Units by mouth daily.    [provider]  Cyanocobalamin (VITAMIN B-12 CR PO) Take 50 mcg by mouth daily.    [provider]  Multiple Vitamin (MULTIVITAMIN) tablet Take 1 tablet by mouth daily.    [provider]  Omega 3 1000 MG CAPS Take 1,000 mg by mouth daily.    [provider]    Family History Family History  Problem Relation Age of Onset  . COPD Mother   . Heart attack Father     Social History Social History   Tobacco Use  . Smoking status: Never Smoker  . Smokeless tobacco: Never Used  Substance Use Topics  . Alcohol use: No  . Drug use: No     Allergies   Patient has no known allergies.   Review of Systems Review of Systems  Constitutional: Positive for chills and fatigue. Negative for diaphoresis and fever.  HENT: Positive for congestion. Negative for rhinorrhea and sore throat.   Respiratory: Positive for cough, chest tightness and shortness of breath. Negative for wheezing and stridor.   Cardiovascular: Positive for chest pain. Negative for palpitations and leg swelling.  Gastrointestinal: Positive for nausea. Negative for abdominal distention, abdominal pain, constipation and diarrhea.  Genitourinary: Negative for dysuria and flank pain.  Musculoskeletal: Negative for back pain, neck pain and neck stiffness.  Skin: Negative for rash and  wound.  Neurological: Negative for light-headedness and headaches.  Psychiatric/Behavioral: Negative for agitation.  All other systems reviewed and are negative.    Physical Exam Updated Vital Signs BP (!) 172/83 (BP Location: Left Arm)   Pulse 65   Temp 97.6 F (36.4 C) (Oral)   Resp (!) 23   Ht 5\' 7"  (1.702 m)   Wt 59.9 kg   SpO2 99%   BMI 20.67 kg/m   Physical Exam  Constitutional: She is oriented to person, place, and time. She appears well-developed and well-nourished. No distress.  HENT:  Head: Normocephalic  and atraumatic.  Mouth/Throat: Oropharynx is clear and moist. No oropharyngeal exudate.  Eyes: Pupils are equal, round, and reactive to light. Conjunctivae are normal.  Neck: Normal range of motion. Neck supple.  Cardiovascular: Normal rate and regular rhythm.  No murmur heard. Pulmonary/Chest: Effort normal. No respiratory distress. She has no wheezes. She has rhonchi in the right lower field and the left lower field. She has no rales. She exhibits no tenderness.  Abdominal: Soft. There is no tenderness.  Musculoskeletal: She exhibits no edema or tenderness.  Neurological: She is alert and oriented to person, place, and time.  Skin: Skin is warm and dry. Capillary refill takes less than 2 seconds. No rash noted. She is not diaphoretic. No erythema.  Psychiatric: She has a normal mood and affect.  Nursing note and vitals reviewed.    ED Treatments / Results  Labs (all labs ordered are listed, but only abnormal results are displayed) Labs Reviewed  COMPREHENSIVE METABOLIC PANEL - Abnormal; Notable for the following components:      Result Value   Sodium 122 (*)    Potassium 2.8 (*)    Chloride 88 (*)    Glucose, Bld 174 (*)    Calcium 8.6 (*)    All other components within normal limits  CBC  D-DIMER, QUANTITATIVE (NOT AT [facility])  LIPASE, BLOOD  I-STAT TROPONIN, ED  POC OCCULT BLOOD, ED  TYPE AND SCREEN  ABO/RH    EKG EKG Interpretation  Date/Time:  Thursday July 07 2018 07:34:31 EST Ventricular Rate:  62 PR Interval:    QRS Duration: 120 QT Interval:  469 QTC Calculation: 477 R Axis:   29 Text Interpretation:  Sinus rhythm Atrial premature complex IVCD, consider atypical RBBB Minimal ST elevation, inferior leads When compared to prior, no significant changes seen.  No STEMI Confirmed by Antony Blackbird [phone]) on 07/07/2018 8:46:01 AM   Radiology Dg Chest 2 View  Result Date: 07/07/2018 CLINICAL DATA:  Cough, hemoptysis versus hematemesis, recent diagnosis of  bronchitis given antibiotics, nausea, burning chest, history hypertension EXAM: CHEST - 2 VIEW COMPARISON:  09/13/2010 FINDINGS: Normal heart size, mediastinal contours, and pulmonary vascularity. Minimal biapical scarring. Lungs hyperinflated but clear. No infiltrate, pleural effusion or pneumothorax. Bones unremarkable. IMPRESSION: Hyperinflation without acute infiltrate. Electronically Signed   By: Lavonia Dana M.D.   On: 07/07/2018 07:54    Procedures Procedures (including critical care time)  Medications Ordered in ED Medications  sodium chloride 0.9 % bolus 1,000 mL (0 mLs Intravenous Stopped 07/07/18 0843)  ondansetron (ZOFRAN) injection 4 mg (4 mg Intravenous Given 07/07/18 0742)  potassium chloride SA (K-DUR,KLOR-CON) CR tablet 40 mEq (40 mEq Oral Given 07/07/18 1008)  ondansetron (ZOFRAN) injection 4 mg (4 mg Intravenous Given 07/07/18 1034)  sodium chloride 0.9 % bolus 500 mL (0 mLs Intravenous Stopped 07/07/18 1347)  promethazine (PHENERGAN) tablet 12.5 mg (12.5 mg Oral Given 07/07/18 1245)  Initial Impression / Assessment and Plan / ED Course  I have reviewed the triage vital signs and the nursing notes.  Pertinent labs & imaging results that were available during my care of the patient were reviewed by me and considered in my medical decision making (see chart for details).     Patricia [name] is a 67 y.o. female with a past medical history significant for hypertension, mitral regurgitation, and prior PVCs who presents with chills, cough, nausea, vomiting, chest discomfort, and either hemoptysis versus hematemesis.  Patient reports that she has been having URI symptoms and cough for the last few days and on Monday was started on doxycycline for bronchitis.  She reports after starting doxycycline she started having some nausea and an episode of vomiting with coughing.  She thought that there is a small amount of blood in this prompting her to go see her doctor yesterday who  switched her to amoxicillin.  She says that she took this yesterday and today and continued to have some of the hemoptysis/hematemesis.  She reports she is having a sharp chest discomfort when she is coughing but does not have pain at rest.  She reports minimal shortness of breath with her coughing fits.  She reports continued chills but has not had fevers at home.  She denies any urinary symptoms or constipation or diarrhea.  No bloody stools or dark tarry stools.  No history of GI bleeds.  Patient is not on blood thinners.  She denies any sick contacts or any recent injuries.  She denies other complaints on arrival.  No history of DVT or PE.  On exam, lungs have slight coarseness at the bases.  No wheezing.  Chest was nontender.  I did not appreciate any murmur.  Legs are nonedematous and nontender.  She denies any long plane flights or car trips.  Abdomen was nontender.  Patient otherwise resting comfortably.  Based on patient's report of URI with cough and possible hemoptysis, patient will have x-ray to look for development of pneumonia and a d-dimer for pulmonary embolism as etiology of symptoms.  She will have screen laboratory testing performed.  Patient appeared dry on exam and feels dehydrated, she will be given fluids and nausea medicine.  Anticipate reassessment after work-up.   10:16 AM Patient's work-up was overall reassuring.  Troponin negative.  D-dimer negative.  Doubt PE.  Lipase not elevated.  CBC reassuring.  Metabolic panel shows hyponatremia and hypokalemia, suspect it is due to the patient's dehydration.  Patient reports feeling much better after fluids.  She will be given oral potassium and will prove she can eat and drink without difficulty.  If patient continues to feel well, patient will be discharged home with plans to follow-up with her PCP after work-up showed no evidence of pneumonia or other intra-thoracic abnormality at this time.  Patient felt better.  Patient will be  discharged home with plans to follow-up with PCP.  Suspect dehydration causing her to have malaise.  Patient will continue her antibiotics as I suspect it was the doxycycline not the amoxicillin that contributed to her nausea and vomiting.  Patient denied any further episodes of hematemesis or hemoptysis.    Patient understood return precautions and was discharged in good condition with improved symptoms   Final Clinical Impressions(s) / ED Diagnoses   Final diagnoses:  Hemoptysis  Non-intractable vomiting with nausea, unspecified vomiting type  Cough    ED Discharge Orders         Ordered  promethazine (PHENERGAN) 12.5 MG tablet  Every 6 hours PRN     07/07/18 1345          Clinical Impression: 1. Hemoptysis   2. Non-intractable vomiting with nausea, unspecified vomiting type   3. Cough     Disposition: Discharge  Condition: Good  I have discussed the results, Dx and Tx plan with the pt(& family if present). He/she/they expressed understanding and agree(s) with the plan. Discharge instructions discussed at great length. Strict return precautions discussed and pt &/or family have verbalized understanding of the instructions. No further questions at time of discharge.    Discharge Medication List as of 07/07/2018  1:47 PM    START taking these medications   Details  promethazine (PHENERGAN) 12.5 MG tablet Take 1 tablet (12.5 mg total) by mouth every 6 (six) hours as needed for nausea or vomiting., Starting Thu 07/07/2018, Print        Follow Up: Seward Carol, MD [address] [place] [address] [phone]     [facility]-EMERGENCY DEPT [place] [id number] mc [address] [place]       , Gwenyth Allegra, MD 07/07/18 [phone]

## 2018-07-07 NOTE — ED Notes (Signed)
Patient transported to X-ray 

## 2018-07-07 NOTE — ED Notes (Signed)
Pt provided with water and graham crackers. 

## 2018-07-07 NOTE — ED Notes (Signed)
Upon going into the room to check on pt. Pt states "I am really queasy. I could only get one of those crackers down" MD made aware

## 2018-07-07 NOTE — ED Notes (Signed)
Pt ambulated to restroom without difficulty

## 2018-07-07 NOTE — ED Triage Notes (Signed)
Patient was dx with bronchitis on Monday and given an antibiotic. Patient went back to doctor on Wednesday because she was throwing up a small amount of blood. Patient got a new antibiotic. Patient states that she started throwing up more blood this am. Patient states she feels nauseas and her throat is on fire.

## 2018-07-07 NOTE — ED Notes (Signed)
Pt states " I still feel bad, actually maybe worse. I am still queasy" MD made aware

## 2018-07-07 NOTE — Discharge Instructions (Signed)
Your work-up today shows no evidence of worsened pneumonia or other significant abnormality.  The blood work for your heart was reassuring and we did not see evidence of blood clot on blood work.  We did find you to have low potassium for which we gave you potassium supplementation.  We suspect you are dehydrated.  You appeared much more comfortable after rehydration and fluids.  After our shared decision making conversation, we feel safe with you finishing your antibiotics you are currently on for the bronchitis/pneumonia.  Please follow-up with your primary doctor in the next several days.  Please stay hydrated.  Please use the nausea medicine to help achieve this.  If any symptoms change or worsen, please return to the nearest emergency department.

## 2018-08-29 DIAGNOSIS — Z0000 Encounter for general adult medical examination without abnormal findings: Secondary | ICD-10-CM | POA: Diagnosis not present

## 2018-08-29 DIAGNOSIS — Z23 Encounter for immunization: Secondary | ICD-10-CM | POA: Diagnosis not present

## 2018-08-29 DIAGNOSIS — F439 Reaction to severe stress, unspecified: Secondary | ICD-10-CM | POA: Diagnosis not present

## 2018-08-29 DIAGNOSIS — R030 Elevated blood-pressure reading, without diagnosis of hypertension: Secondary | ICD-10-CM | POA: Diagnosis not present

## 2018-08-29 DIAGNOSIS — Z1389 Encounter for screening for other disorder: Secondary | ICD-10-CM | POA: Diagnosis not present

## 2018-09-05 DIAGNOSIS — H2513 Age-related nuclear cataract, bilateral: Secondary | ICD-10-CM | POA: Diagnosis not present

## 2018-09-05 DIAGNOSIS — H1851 Endothelial corneal dystrophy: Secondary | ICD-10-CM | POA: Diagnosis not present

## 2018-09-06 DIAGNOSIS — L819 Disorder of pigmentation, unspecified: Secondary | ICD-10-CM | POA: Diagnosis not present

## 2018-09-06 DIAGNOSIS — Z85828 Personal history of other malignant neoplasm of skin: Secondary | ICD-10-CM | POA: Diagnosis not present

## 2018-09-06 DIAGNOSIS — D229 Melanocytic nevi, unspecified: Secondary | ICD-10-CM | POA: Diagnosis not present

## 2018-09-06 DIAGNOSIS — L821 Other seborrheic keratosis: Secondary | ICD-10-CM | POA: Diagnosis not present

## 2018-09-06 DIAGNOSIS — L814 Other melanin hyperpigmentation: Secondary | ICD-10-CM | POA: Diagnosis not present

## 2018-09-22 DIAGNOSIS — Z1211 Encounter for screening for malignant neoplasm of colon: Secondary | ICD-10-CM | POA: Diagnosis not present

## 2018-09-22 DIAGNOSIS — Z86010 Personal history of colonic polyps: Secondary | ICD-10-CM | POA: Diagnosis not present

## 2018-09-27 DIAGNOSIS — F439 Reaction to severe stress, unspecified: Secondary | ICD-10-CM | POA: Diagnosis not present

## 2018-09-27 DIAGNOSIS — R030 Elevated blood-pressure reading, without diagnosis of hypertension: Secondary | ICD-10-CM | POA: Diagnosis not present

## 2018-10-04 ENCOUNTER — Ambulatory Visit (INDEPENDENT_AMBULATORY_CARE_PROVIDER_SITE_OTHER): Payer: Medicare Other | Admitting: Podiatry

## 2018-10-04 DIAGNOSIS — M79672 Pain in left foot: Secondary | ICD-10-CM

## 2018-10-04 DIAGNOSIS — M216X2 Other acquired deformities of left foot: Secondary | ICD-10-CM | POA: Diagnosis not present

## 2018-10-04 DIAGNOSIS — Q828 Other specified congenital malformations of skin: Secondary | ICD-10-CM | POA: Diagnosis not present

## 2018-10-04 DIAGNOSIS — M21619 Bunion of unspecified foot: Secondary | ICD-10-CM

## 2018-10-06 DIAGNOSIS — F419 Anxiety disorder, unspecified: Secondary | ICD-10-CM | POA: Diagnosis not present

## 2018-10-06 DIAGNOSIS — I10 Essential (primary) hypertension: Secondary | ICD-10-CM | POA: Diagnosis not present

## 2018-10-06 NOTE — Progress Notes (Signed)
Subjective: 68 year old female presents the office today for concerns of painful corn, callus to the left foot.  She was getting calluses first and second toes however he states that she still gets them but they are no longer painful and she is using the toe separators.  The toe separators cause some discomfort but is actually helped the pain that she had with the calluses.  She denies any redness or drainage or any swelling of the callus sites and she has no other concerns today.Denies any systemic complaints such as fevers, chills, nausea, vomiting. No acute changes since last appointment, and no other complaints at this time.   Objective: AAO x3, NAD DP/PT pulses palpable bilaterally, CRT less than 3 seconds 2 annular hyperkeratotic lesions present to the fifth metatarsal head plantarly, laterally.  Very minimal discomfort with mild hyperkeratotic lesions along the interspace in the left first between her hallux and second toe.  1 debrided the lesions there is no underlying ulceration drainage or any signs of infection. Bunion present  No other open lesions or pre-ulcerative lesions. No pain with calf compression, swelling, warmth, erythema  Assessment: Hyperkeratotic lesions left foot  Plan: -All treatment options discussed with the patient including all alternatives, risks, complications.  -Lesions were sharply debrided any complications or bleeding. -We will check orthotic coverage and see if he can get an accommodative orthotic to help offload the callus sites. -Monitor for any clinical signs or symptoms of infection and directed to call the office immediately should any occur or go to the ER.  Return if symptoms worsen or fail to improve.  Trula Slade DPM

## 2018-10-27 DIAGNOSIS — I10 Essential (primary) hypertension: Secondary | ICD-10-CM | POA: Diagnosis not present

## 2018-11-16 DIAGNOSIS — N958 Other specified menopausal and perimenopausal disorders: Secondary | ICD-10-CM | POA: Diagnosis not present

## 2018-11-16 DIAGNOSIS — M816 Localized osteoporosis [Lequesne]: Secondary | ICD-10-CM | POA: Diagnosis not present

## 2019-02-17 DIAGNOSIS — R079 Chest pain, unspecified: Secondary | ICD-10-CM | POA: Diagnosis not present

## 2019-02-17 DIAGNOSIS — I10 Essential (primary) hypertension: Secondary | ICD-10-CM | POA: Diagnosis not present

## 2019-02-17 DIAGNOSIS — I491 Atrial premature depolarization: Secondary | ICD-10-CM | POA: Diagnosis not present

## 2019-02-17 DIAGNOSIS — I4510 Unspecified right bundle-branch block: Secondary | ICD-10-CM | POA: Diagnosis not present

## 2019-02-21 DIAGNOSIS — I10 Essential (primary) hypertension: Secondary | ICD-10-CM | POA: Diagnosis not present

## 2019-02-21 DIAGNOSIS — R079 Chest pain, unspecified: Secondary | ICD-10-CM | POA: Diagnosis not present

## 2019-02-28 DIAGNOSIS — R079 Chest pain, unspecified: Secondary | ICD-10-CM | POA: Diagnosis not present

## 2019-02-28 DIAGNOSIS — Z1211 Encounter for screening for malignant neoplasm of colon: Secondary | ICD-10-CM | POA: Diagnosis not present

## 2019-02-28 DIAGNOSIS — Z86010 Personal history of colonic polyps: Secondary | ICD-10-CM | POA: Diagnosis not present

## 2019-02-28 DIAGNOSIS — R194 Change in bowel habit: Secondary | ICD-10-CM | POA: Diagnosis not present

## 2019-03-01 ENCOUNTER — Ambulatory Visit (INDEPENDENT_AMBULATORY_CARE_PROVIDER_SITE_OTHER): Payer: Medicare Other | Admitting: Cardiology

## 2019-03-01 ENCOUNTER — Encounter: Payer: Self-pay | Admitting: Cardiology

## 2019-03-01 ENCOUNTER — Other Ambulatory Visit: Payer: Self-pay

## 2019-03-01 VITALS — BP 150/76 | HR 78 | Ht 67.0 in | Wt 127.1 lb

## 2019-03-01 DIAGNOSIS — K30 Functional dyspepsia: Secondary | ICD-10-CM | POA: Diagnosis not present

## 2019-03-01 DIAGNOSIS — I340 Nonrheumatic mitral (valve) insufficiency: Secondary | ICD-10-CM | POA: Diagnosis not present

## 2019-03-01 DIAGNOSIS — R002 Palpitations: Secondary | ICD-10-CM | POA: Diagnosis not present

## 2019-03-01 DIAGNOSIS — E785 Hyperlipidemia, unspecified: Secondary | ICD-10-CM | POA: Diagnosis not present

## 2019-03-01 DIAGNOSIS — R079 Chest pain, unspecified: Secondary | ICD-10-CM | POA: Diagnosis not present

## 2019-03-01 DIAGNOSIS — R072 Precordial pain: Secondary | ICD-10-CM | POA: Diagnosis not present

## 2019-03-01 DIAGNOSIS — I10 Essential (primary) hypertension: Secondary | ICD-10-CM

## 2019-03-01 MED ORDER — LOSARTAN POTASSIUM 25 MG PO TABS: 25.0000 mg | ORAL_TABLET | Freq: Every day | ORAL | 0 refills | Status: DC

## 2019-03-01 NOTE — Patient Instructions (Signed)
Medication Instructions:  Your physician recommends that you continue on your current medications as directed. Please refer to the Current Medication list given to you today.  If you need a refill on your cardiac medications before your next appointment, please call your pharmacy.   Lab work: None If you have labs (blood work) drawn today and your tests are completely normal, you will receive your results only by: Marland Kitchen MyChart Message (if you have MyChart) OR . A paper copy in the mail If you have any lab test that is abnormal or we need to change your treatment, we will call you to review the results.  Testing/Procedures: You will be scheduled for a Calcium Score CT. You will be contacted about the appointment time and date  Follow-Up: At [facility], you and your health needs are our priority.  As part of our continuing mission to provide you with exceptional heart care, we have created designated Provider Care Teams.  These Care Teams include your primary Cardiologist (physician) and Advanced Practice Providers (APPs -  Physician Assistants and Nurse Practitioners) who all work together to provide you with the care you need, when you need it. You will need a follow up appointment in 4 weeks.  Any Other Special Instructions Will Be Listed Below (If Applicable).   Get an Arm Blood pressure cuff and record daily blood pressures. Bring to next appointment.

## 2019-03-01 NOTE — Progress Notes (Signed)
Cardiology Office Note:    Date:  03/01/2019   ID:  Patricia [name], DOB 05-May-1951, MRN [id number]  PCP:  Patricia Carol, MD  Cardiologist:  Patricia Campus, MD  Electrophysiologist:  None   Referring MD: Patricia Carol, MD   Chief Complaint: 68 yo female presents for consultation of chest pain.   History of Present Illness:    Patricia [name] is a 68 y.o. female with a hx of HTN, mitral regurgitation, palpitations, PVCs. Seen today for consultation of chest pain. She was last seen by Dr. Radford [name] in 09/2014. Her last echocardiogram was in 2014.   She had a negative troponin 02/21/19.  Labs via 08/29/18 via [facility] shows normal liver function, normal kidney function, TSH 1.16.  She tells me she saw her PCP in January and was noted to have elevated blood pressure.  She was asked to check her blood pressure regularly and in March was started on losartan 25 mg daily.  Over the last month she reports she has had increased gas, GI problems.  She has seen her GI doctor, Dr. Collene [name], who would like to perform colonoscopy.  Of note she does still have her gallbladder.  On June 27 she had a large [place] meal and before bedtime noted some indigestion.  She woke up at 6:00 with indigestion and chest "pressure" in her midsternal area to the right below her breastbone.  She took a baby aspirin and called EMS.  EMS took an EKG which shows sinus rhythm and incomplete right bundle branch block stable from previous.  She does note that she snores at night if she is on her left side.  Low suspicion of OSA due to body habitus and not continuous snoring.  Her blood pressures were elevated while with EMS with systolic readings in the 354S to 190s. They left up to her on whether she wanted to be transported to the hospital.  She was feeling better and asked to stay at home, but was asked to create follow-up appointments which she is done.  She has been checking her blood pressure at home with a wrist cuff but when compared today  to our manual reading her wrist cuff was 15 points higher.  She was asked to purchase an arm cuff for better monitoring.  Family history notable for her dad passing after his third MI at age of 4.  Reports her mother had late onset of cardiac disease in her 29s.  Her personal history as noted for mitral valve prolapse that was then not seen on echocardiogram in 2014 she did have mild mitral regurgitation.  She has not had echocardiogram since.  She has history of PVCs but reports these do not bother her.  She is very active exercising via Zumba classes 5 days/week.  She is a never smoker.  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  She enjoys spending time with her husband and grandchildren.  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   ALPRAZolam (XANAX) 0.25 MG tablet Take 0.25 mg by mouth as needed.    Ascorbic Acid (VITAMIN C) 1000 MG tablet Take 1,000 mg by mouth daily.   Biotin 10000 MCG TABS Take 1 tablet by mouth daily.   Calcium Carbonate-Vitamin D (CALCIUM 600+D) 600-400 MG-UNIT per tablet Take 1 tablet by mouth daily.   carboxymethylcellulose (REFRESH PLUS) 0.5 % SOLN Place 1 drop into both eyes daily as needed (dry eyes).   cetirizine (ZYRTEC) 10 MG tablet Take 10 mg by mouth daily as needed for allergies.   Cholecalciferol (VITAMIN D3 SUPER STRENGTH) 50 MCG (2000 UT) TABS Take 1 tablet by mouth daily.   Cyanocobalamin 2500 MCG CHEW Chew 1 tablet by  mouth daily.   losartan (COZAAR) 25 MG tablet Take 1 tablet (25 mg total) by mouth daily.   Multiple Vitamin (MULTIVITAMIN) tablet Take 1 tablet by mouth daily.   Omega 3-6-9 Fatty Acids (OMEGA 3-6-9 COMPLEX PO) Take 1 tablet by mouth daily.   sodium chloride (OCEAN) 0.65 % SOLN nasal spray Place 1 spray into both nostrils as needed for congestion.   [DISCONTINUED] losartan (COZAAR) 25 MG tablet Take 25 mg by mouth daily.      Allergies:   Doxycycline   Social History   Socioeconomic History   Marital status: Married    Spouse name: Not on file   Number of children: Not on file   Years of education: Not on file   Highest education level: Not on file  Occupational History   Not on file  Social Needs   Financial resource strain: Not on file   Food insecurity    Worry: Not on file    Inability: Not on file   Transportation needs    Medical: Not on file    Non-medical: Not on file  Tobacco Use   Smoking status: Never Smoker   Smokeless tobacco: Never Used  Substance and Sexual Activity   Alcohol use: Yes    Alcohol/week: 2.0 - 3.0 standard drinks    Types: 2 - 3 Glasses of wine per week   Drug use: No   Sexual activity: Not on file  Lifestyle   Physical activity    Days per week: Not on file    Minutes per session: Not on file   Stress: Not on file  Relationships   Social connections    Talks on phone: Not on file    Gets together: Not on file    Attends religious service: Not on file    Active member of club or organization: Not on file    Attends meetings of clubs or organizations: Not on file    Relationship status: Not on file  Other Topics Concern   Not on file  Social History Narrative   Not on file     Family History: The patient's family history includes COPD in her mother; Heart attack in her father.  ROS:   Please see the history of present illness.    Review of Systems  Constitution: Negative for chills, fever and  malaise/fatigue.  Cardiovascular: Positive for chest pain (one episode detailed in HPI) and palpitations (history PVC). Negative for dyspnea on exertion, leg swelling and near-syncope.  Respiratory: Negative for cough, shortness of breath and wheezing.   Gastrointestinal: Positive for constipation (following with GI). Negative for nausea and vomiting.       (+)  indigestion  Neurological: Negative for excessive daytime sleepiness, dizziness, light-headedness and weakness.   All other systems reviewed and are negative.  EKGs/Labs/Other Studies Reviewed:    The following studies were reviewed today: Echo 2014 EF 68% and mild MR. No MVP.   EKG:  EKG is reviewed from EMS visit 02/19/19 shows sinus rhythm rate 71 with incomplete RBBB. Stable when compared to previous 06/2018.   Recent Labs: Labs 08/29/18 via [facility] show TSH 1.16, creatinine 0.79.    Recent Lipid Panel Last lipid panel 06/2017 via [facility] shows total256 HDL 93 LDL 139.  Physical Exam:    VS:  BP (!) 150/76    Pulse 78    Ht 5\' 7"  (1.702 m)    Wt 127 lb 1.9 oz (57.7 kg)    SpO2 98%    BMI 19.91 kg/m     Wt Readings from Last 3 Encounters:  03/01/19 127 lb 1.9 oz (57.7 kg)  07/07/18 132 lb (59.9 kg)  09/25/14 138 lb (62.6 kg)     GEN:  Well nourished, well developed in no acute distress HEENT: Normal NECK: No JVD; No carotid bruits LYMPHATICS: No lymphadenopathy CARDIAC: RRR, no murmurs, rubs, gallops RESPIRATORY:   Clear to auscultation without rales, wheezing or rhonchi  ABDOMEN: Soft, non-tender, non-distended MUSCULOSKELETAL:  No  edema; No deformity  SKIN: Warm and dry NEUROLOGIC:  Alert and oriented x 3 PSYCHIATRIC:  Normal affect   ASSESSMENT:    1. Chest pain, unspecified type   2. Benign essential HTN   3. Hyperlipidemia, unspecified hyperlipidemia type   4. Palpitations   5. Nonrheumatic mitral valve regurgitation   6. Precordial pain   7. Indigestion    PLAN:    In order of problems listed  above:  1. Chest pain - Episode of "chest pressure" in the midsternal down to below breastbone associated with indigestion 02/19/19 for which she called EMS rate 5/10. No SOB, no radiation. EKG was without acute ST/T wave changes. She is able to participate in regular exercise without difficulty. Low suspicion ACS - will obtain calcium score due to risk factors of HTN, family history of MI.  2. HTN - Elevated today. Started on Losartan 25mg  daily by PCP in March. Asked her to purchase arm BP cuff as her wrist cuff is inaccurate. She asks if we will take over prescribing and I am agreeable. Will reassess BP at office visit in 4 weeks and she will bring log of BP.  3. HLD - Last lipid profile 2018 with LDL of 139. Unable to obtain fasting level today, will plan to collect at next office visit. Not currently on statin - calcium score will help [place] to determine plan. 4. Mitral regurgitation - Last echocardiogram 2014 with mild MR. Will repeat echocardiogram.  5. PVCs - Seen on previous monitor. She reports they do not affect her lifestyle. Would not place on medication at this time. Recommend she avoid excessive caffeine use and continue her regular exercise.  6. RBBB - Incomplete RBBB noted on EKG by EMS 02/19/19. Also noted on previous EKG. No syncope, dizziness. Continue to monitor.  7. Indigestion - Seen by GI for indigestion, increased gassiness, dry hard stool. They have scheduled colonoscopy for August. Her functional capacity METs is 8.97 per the [facility]. Colonoscopy is considered low risk procedure. She is deemed acceptable risk for colonoscopy and I will forward my note to Dr. Collene [name].    Medication Adjustments/Labs and Tests Ordered: Current medicines are reviewed at length with  the patient today.  Concerns regarding medicines are outlined above.  Orders Placed This Encounter  Procedures   CT CARDIAC SCORING   ECHOCARDIOGRAM COMPLETE   Meds ordered this encounter  Medications   losartan (COZAAR)  25 MG tablet    Sig: Take 1 tablet (25 mg total) by mouth daily.    Dispense:  30 tablet    Refill:  0    Order Specific Question:   Supervising Provider    Answer:   Richardo Priest [[id number]]    Patient Instructions  Medication Instructions:  Your physician recommends that you continue on your current medications as directed. Please refer to the Current Medication list given to you today.  If you need a refill on your cardiac medications before your next appointment, please call your pharmacy.   Lab work: None If you have labs (blood work) drawn today and your tests are completely normal, you will receive your results only by:  [place] (if you have MyChart) OR  A paper copy in the mail If you have any lab test that is abnormal or we need to change your treatment, we will call you to review the results.  Testing/Procedures: You will be scheduled for a Calcium Score CT. You will be contacted about the appointment time and date  Follow-Up: At [facility], you and your health needs are our priority.  As part of our continuing mission to provide you with exceptional heart care, we have created designated Provider Care Teams.  These Care Teams include your primary Cardiologist (physician) and Advanced Practice Providers (APPs -  Physician Assistants and Nurse Practitioners) who all work together to provide you with the care you need, when you need it. You will need a follow up appointment in 4 weeks.  Any Other Special Instructions Will Be Listed Below (If Applicable).   Get an Arm Blood pressure cuff and record daily blood pressures. Bring to next appointment.      Signed, Patricia Campus, MD  03/01/2019 10:08 AM    [place]

## 2019-03-03 ENCOUNTER — Encounter: Payer: Self-pay | Admitting: Cardiology

## 2019-03-09 ENCOUNTER — Ambulatory Visit (HOSPITAL_BASED_OUTPATIENT_CLINIC_OR_DEPARTMENT_OTHER)
Admission: RE | Admit: 2019-03-09 | Discharge: 2019-03-09 | Disposition: A | Discharge status: Home / Self Care | Payer: Medicare Other | Source: Ambulatory Visit | Attending: Cardiology | Admitting: Cardiology

## 2019-03-09 ENCOUNTER — Other Ambulatory Visit: Payer: Self-pay

## 2019-03-09 DIAGNOSIS — R079 Chest pain, unspecified: Secondary | ICD-10-CM | POA: Diagnosis not present

## 2019-03-09 NOTE — Progress Notes (Signed)
  Echocardiogram 2D Echocardiogram has been performed.  Patricia [name] 03/09/2019, 11:48 AM

## 2019-03-14 ENCOUNTER — Telehealth: Payer: Self-pay | Admitting: Emergency Medicine

## 2019-03-14 NOTE — Telephone Encounter (Signed)
Left message for patient to return call for results.

## 2019-03-29 ENCOUNTER — Other Ambulatory Visit: Payer: Self-pay

## 2019-03-29 ENCOUNTER — Ambulatory Visit (INDEPENDENT_AMBULATORY_CARE_PROVIDER_SITE_OTHER)
Admission: RE | Admit: 2019-03-29 | Discharge: 2019-03-29 | Disposition: A | Discharge status: Home / Self Care | Payer: Self-pay | Source: Ambulatory Visit | Attending: Cardiology | Admitting: Cardiology

## 2019-03-29 DIAGNOSIS — I10 Essential (primary) hypertension: Secondary | ICD-10-CM

## 2019-03-29 DIAGNOSIS — R079 Chest pain, unspecified: Secondary | ICD-10-CM

## 2019-03-29 DIAGNOSIS — R072 Precordial pain: Secondary | ICD-10-CM

## 2019-03-29 DIAGNOSIS — R002 Palpitations: Secondary | ICD-10-CM

## 2019-03-31 ENCOUNTER — Ambulatory Visit: Payer: Medicare Other | Admitting: Cardiology

## 2019-04-04 ENCOUNTER — Telehealth: Payer: Self-pay | Admitting: Emergency Medicine

## 2019-04-04 ENCOUNTER — Telehealth: Payer: Self-pay | Admitting: Cardiology

## 2019-04-04 NOTE — Telephone Encounter (Signed)
Left message for patient to return call regarding results  

## 2019-04-04 NOTE — Telephone Encounter (Signed)
Patient informed of results.  

## 2019-04-10 DIAGNOSIS — Z1211 Encounter for screening for malignant neoplasm of colon: Secondary | ICD-10-CM | POA: Diagnosis not present

## 2019-04-10 DIAGNOSIS — D125 Benign neoplasm of sigmoid colon: Secondary | ICD-10-CM | POA: Diagnosis not present

## 2019-04-10 DIAGNOSIS — K635 Polyp of colon: Secondary | ICD-10-CM | POA: Diagnosis not present

## 2019-04-10 DIAGNOSIS — Z86010 Personal history of colonic polyps: Secondary | ICD-10-CM | POA: Diagnosis not present

## 2019-04-12 ENCOUNTER — Ambulatory Visit: Payer: Medicare Other | Admitting: Cardiology

## 2019-04-13 DIAGNOSIS — Z20828 Contact with and (suspected) exposure to other viral communicable diseases: Secondary | ICD-10-CM | POA: Diagnosis not present

## 2019-04-20 ENCOUNTER — Encounter: Payer: Self-pay | Admitting: Cardiology

## 2019-04-20 ENCOUNTER — Other Ambulatory Visit: Payer: Self-pay

## 2019-04-20 ENCOUNTER — Ambulatory Visit (INDEPENDENT_AMBULATORY_CARE_PROVIDER_SITE_OTHER): Payer: Medicare Other | Admitting: Cardiology

## 2019-04-20 VITALS — BP 140/82 | HR 72 | Ht 67.0 in | Wt 128.8 lb

## 2019-04-20 DIAGNOSIS — R079 Chest pain, unspecified: Secondary | ICD-10-CM

## 2019-04-20 DIAGNOSIS — I340 Nonrheumatic mitral (valve) insufficiency: Secondary | ICD-10-CM | POA: Diagnosis not present

## 2019-04-20 DIAGNOSIS — I2510 Atherosclerotic heart disease of native coronary artery without angina pectoris: Secondary | ICD-10-CM | POA: Diagnosis not present

## 2019-04-20 DIAGNOSIS — I10 Essential (primary) hypertension: Secondary | ICD-10-CM | POA: Diagnosis not present

## 2019-04-20 NOTE — Addendum Note (Signed)
Addended by: Ashok Norris on: 04/20/2019 04:10 PM   Modules accepted: Orders

## 2019-04-20 NOTE — Progress Notes (Signed)
Cardiology Office Note:    Date:  04/20/2019   ID:  Patricia [name], DOB 29-Jan-1951, MRN [id number]  PCP:  Seward Carol, MD  Cardiologist:  Jenne Campus, MD    Referring MD: Seward Carol, MD   Chief Complaint  Patient presents with  . Follow-up    History of Present Illness:    Patricia [name] is a 68 y.o. female doing great asymptomatic.  Originally she was referred to [place] because of episodes of chest pain that happened after heavy meal.  We elected to proceed with a calcium score.  Calcium score came 182 which is 84% all of her age group.  She is here to talk about this.  Luckily clinically she is doing very well she exercised on the regular basis 3-4 times a week she does aggressive exercise program with resume and her trainer who was at the gym.  She has no problem doing this.  There is no chest pain tightness squeezing pressure burning chest when she does this.  Overall she is doing well she is asymptomatic.  We talked in length about what to do with the situation with talking eventually about potentially doing stress test but since she is completely asymptomatic I do not think there is any role for it.  We talked about healthy lifestyle good diet.  It is also time to with her cholesterol with her calcium score being 182 I would initiate a cholesterol therapy if her cholesterol is still elevated.  Past Medical History:  Diagnosis Date  . Benign essential HTN 09/25/2014  . MR (mitral regurgitation)    mild by echo 2014 with no evidence of MVP  . Osteoporosis    tx w fosomax X5 years and boniva X2 currently not on treatment  . Patent foramen ovale   . 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 none      Current Medications: Current Meds  Medication Sig  . acetaminophen (TYLENOL) 325 MG tablet Take 650 mg by mouth every 6 (six) hours as needed for mild pain or headache.  . ALPRAZolam (XANAX) 0.25 MG tablet Take  0.25 mg by mouth as needed.   . Ascorbic Acid (VITAMIN C) 1000 MG tablet Take 1,000 mg by mouth daily.  . Biotin 10000 MCG TABS Take 1 tablet by mouth daily.  . Calcium Carbonate-Vitamin D (CALCIUM 600+D) 600-400 MG-UNIT per tablet Take 1 tablet by mouth daily.  . carboxymethylcellulose (REFRESH PLUS) 0.5 % SOLN Place 1 drop into both eyes daily as needed (dry eyes).  . cetirizine (ZYRTEC) 10 MG tablet Take 10 mg by mouth daily as needed for allergies.  . Cholecalciferol (VITAMIN D3 SUPER STRENGTH) 50 MCG (2000 UT) TABS Take 1 tablet by mouth daily.  . Cyanocobalamin 2500 MCG CHEW Chew 1 tablet by mouth daily.  Marland Kitchen GAVILYTE-G 236 g solution See admin instructions.  Marland Kitchen losartan (COZAAR) 25 MG tablet Take 1 tablet (25 mg total) by mouth daily.  . Multiple Vitamin (MULTIVITAMIN) tablet Take 1 tablet by mouth daily.  . Omega 3-6-9 Fatty Acids (OMEGA 3-6-9 COMPLEX PO) Take 1 tablet by mouth daily.  . sodium chloride (OCEAN) 0.65 % SOLN nasal spray Place 1 spray into both nostrils as needed for congestion.     Allergies:   Doxycycline   Social History   Socioeconomic History  . Marital status: Married    Spouse name: Not on file  . Number of  children: Not on file  . Years of education: Not on file  . Highest education level: Not on file  Occupational History  . Not on file  Social Needs  . Financial resource strain: Not on file  . Food insecurity    Worry: Not on file    Inability: Not on file  . Transportation needs    Medical: Not on file    Non-medical: Not on file  Tobacco Use  . Smoking status: Never Smoker  . Smokeless tobacco: Never Used  Substance and Sexual Activity  . Alcohol use: Yes    Alcohol/week: 2.0 - 3.0 standard drinks    Types: 2 - 3 Glasses of wine per week  . Drug use: No  . Sexual activity: Not on file  Lifestyle  . Physical activity    Days per week: Not on file    Minutes per session: Not on file  . Stress: Not on file  Relationships  . Social  Herbalist on phone: Not on file    Gets together: Not on file    Attends religious service: Not on file    Active member of club or organization: Not on file    Attends meetings of clubs or organizations: Not on file    Relationship status: Not on file  Other Topics Concern  . Not on file  Social History Narrative  . Not on file     Family History: The patient's family history includes COPD in her mother; Heart attack in her father. ROS:   Please see the history of present illness.    All 14 point review of systems negative except as described per history of present illness  EKGs/Labs/Other Studies Reviewed:      Recent Labs: 07/07/2018: ALT 18; BUN 11; Creatinine, Ser 0.74; Hemoglobin 13.6; Platelets 191; Potassium 2.8; Sodium 122  Recent Lipid Panel No results found for: CHOL, TRIG, HDL, CHOLHDL, VLDL, LDLCALC, LDLDIRECT  Physical Exam:    VS:  BP 140/82   Pulse 72   Ht 5\' 7"  (1.702 m)   Wt 128 lb 12.8 oz (58.4 kg)   SpO2 98%   BMI 20.17 kg/m     Wt Readings from Last 3 Encounters:  04/20/19 128 lb 12.8 oz (58.4 kg)  03/01/19 127 lb 1.9 oz (57.7 kg)  07/07/18 132 lb (59.9 kg)     GEN:  Well nourished, well developed in no acute distress HEENT: Normal NECK: No JVD; No carotid bruits LYMPHATICS: No lymphadenopathy CARDIAC: RRR, no murmurs, no rubs, no gallops RESPIRATORY:  Clear to auscultation without rales, wheezing or rhonchi  ABDOMEN: Soft, non-tender, non-distended MUSCULOSKELETAL:  No edema; No deformity  SKIN: Warm and dry LOWER EXTREMITIES: no swelling NEUROLOGIC:  Alert and oriented x 3 PSYCHIATRIC:  Normal affect   ASSESSMENT:    1. Benign essential HTN   2. Coronary artery disease involving native coronary artery of native heart without angina pectoris   3. Nonrheumatic mitral valve regurgitation   4. Chest pain, unspecified type    PLAN:    In order of problems listed above:  1. Benign essential hypertension she is taking 25  mg losartan.  She purchase blood pressure monitor her blood pressure is perfect we think about potentially discontinuation of this medication however before she does it I want her to check accuracy of her device.  If device is accurate then I will stop the medication. 2. Coronary disease with calcification noted on her right coronary  as well as LAD.  She is completely asymptomatic doing aggressive exercise program with no difficulties.  Therefore we will continue with aspirin.  I asked her to let me know if she got any symptoms. 3. Dyslipidemia we will check fasting lipid profile initiate cholesterol therapy.   Medication Adjustments/Labs and Tests Ordered: Current medicines are reviewed at length with the patient today.  Concerns regarding medicines are outlined above.  No orders of the defined types were placed in this encounter.  Medication changes: No orders of the defined types were placed in this encounter.   Signed, Park Liter, MD, [facility] 04/20/2019 4:04 PM    [facility]

## 2019-04-20 NOTE — Patient Instructions (Signed)
Medication Instructions:  Your physician recommends that you continue on your current medications as directed. Please refer to the Current Medication list given to you today.  If you need a refill on your cardiac medications before your next appointment, please call your pharmacy.   Lab work: Your physician recommends that you return for lab work today: lipids   If you have labs (blood work) drawn today and your tests are completely normal, you will receive your results only by: [place] MyChart Message (if you have MyChart) OR . A paper copy in the mail If you have any lab test that is abnormal or we need to change your treatment, we will call you to review the results.  Testing/Procedures: None.   Follow-Up: At [facility], you and your health needs are our priority.  As part of our continuing mission to provide you with exceptional heart care, we have created designated Provider Care Teams.  These Care Teams include your primary Cardiologist (physician) and Advanced Practice Providers (APPs -  Physician Assistants and Nurse Practitioners) who all work together to provide you with the care you need, when you need it. You will need a follow up appointment in 2 months.  Please call our office 2 months in advance to schedule this appointment.  You may see Jenne Campus, MD or another member of our [place] Provider Team in [place]: Shirlee More, MD . Jyl Heinz, MD  Any Other Special Instructions Will Be Listed Below (If Applicable).

## 2019-06-15 DIAGNOSIS — I2510 Atherosclerotic heart disease of native coronary artery without angina pectoris: Secondary | ICD-10-CM | POA: Diagnosis not present

## 2019-06-15 DIAGNOSIS — Z23 Encounter for immunization: Secondary | ICD-10-CM | POA: Diagnosis not present

## 2019-06-16 LAB — LIPID PANEL
Chol/HDL Ratio: 2.6 ratio (ref 0.0–4.4)
Cholesterol, Total: 243 mg/dL — ABNORMAL HIGH (ref 100–199)
HDL: 93 mg/dL (ref >39)
LDL Chol Calc (NIH): 140 mg/dL — ABNORMAL HIGH (ref 0–99)
Triglycerides: 58 mg/dL (ref 0–149)
VLDL Cholesterol Cal: 10 mg/dL (ref 5–40)

## 2019-06-19 ENCOUNTER — Telehealth: Payer: Self-pay | Admitting: Emergency Medicine

## 2019-06-19 DIAGNOSIS — E785 Hyperlipidemia, unspecified: Secondary | ICD-10-CM

## 2019-06-19 MED ORDER — ATORVASTATIN CALCIUM 10 MG PO TABS: 10.0000 mg | ORAL_TABLET | Freq: Every day | ORAL | 1 refills | Status: DC

## 2019-06-19 NOTE — Telephone Encounter (Signed)
Called patient informed her of results and advised her per Dr. Agustin Cree to start lipitor 10 mg daily and have labs redrawn in 6 weeks. Patient verbally understands. No further questions.

## 2019-06-22 ENCOUNTER — Ambulatory Visit (INDEPENDENT_AMBULATORY_CARE_PROVIDER_SITE_OTHER): Payer: Medicare Other | Admitting: Family

## 2019-06-22 ENCOUNTER — Encounter: Payer: Self-pay | Admitting: Family

## 2019-06-22 ENCOUNTER — Other Ambulatory Visit: Payer: Self-pay

## 2019-06-22 VITALS — BP 158/95 | HR 75 | Ht 67.0 in | Wt 127.0 lb

## 2019-06-22 DIAGNOSIS — E782 Mixed hyperlipidemia: Secondary | ICD-10-CM | POA: Diagnosis not present

## 2019-06-22 DIAGNOSIS — I2510 Atherosclerotic heart disease of native coronary artery without angina pectoris: Secondary | ICD-10-CM

## 2019-06-22 DIAGNOSIS — I10 Essential (primary) hypertension: Secondary | ICD-10-CM

## 2019-06-22 MED ORDER — ASPIRIN EC 81 MG PO TBEC: 81.0000 mg | DELAYED_RELEASE_TABLET | Freq: Every day | ORAL | 3 refills | Status: DC

## 2019-06-22 NOTE — Progress Notes (Signed)
Office Visit    Patient Name: Patricia [name] Date of Encounter: 06/22/2019  Primary Care Provider:  Seward Carol, MD Primary Cardiologist:  Jenne Campus, MD Electrophysiologist:  None   Chief Complaint    Patricia [name] is a 68 y.o. female with a hx of chest pain, CAD, DLD,HTN, hx mild MR by echo 2014  presents today for 2 month f/u of HTN and medication management.   Past Medical History    Past Medical History:  Diagnosis Date  . Benign essential HTN 09/25/2014  . MR (mitral regurgitation)    mild by echo 2014 with no evidence of MVP  . Osteoporosis    tx w fosomax X5 years and boniva X2 currently not on treatment  . Patent foramen ovale   . 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 none      Allergies  Allergies  Allergen Reactions  . Doxycycline Nausea And Vomiting    Became Dehydrated    History of Present Illness    Patricia [name] is a 68 y.o. female with a hx of  hx of chest pain, CAD, DLD,HTN, hx mild MR by echo 2014  last seen 04/20/19 by Dr. Agustin Cree.  Echo 03/09/2019 EF 60 to 65%, pseudonormal diastolic parameters, RV normal size and function, no valvular abnormality.  CT calcium score 03/29/2019 with calcium score 182 which was 83rd percentile for age and sex matched control.  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  She enjoys doing her Zumba classes for exercise 5 days/week and spending time with her grandchildren.  She reports feeling overall well.  She brought her blood pressure cuff in today for our review and comparison.  Our reading was 158/95 and her cuff read 149/97.  She is going to look at her manual and see if she can adjust her cuff.  Long discussion regarding her family history.  Notes that her brother and sister both have heart problems and her father has a history of an MI.  She endorses a heart healthy diet.  She does regular cardiovascular activity.  We discussed  the majority of her lipid profile and plaque on her coronary arteries is likely due to her genetic profile.  Blood pressure at home routinely running in the 120s to 130s over 80s.  After shared and educated decision making we agreed to continue her losartan at 25 mg daily.  She has plans to recheck her lipid and liver function in 6 weeks as she was started on atorvastatin 10 mg daily just this Monday.  We discussed primary and secondary prevention of coronary artery disease and she is agreeable to start a baby aspirin daily.  She has no anginal symptoms.  She reports no chest pain, no shortness of breath, no DOE, no edema, no palpitations.  She was educated to call our office and report any of the symptoms.  EKGs/Labs/Other Studies Reviewed:   The following studies were reviewed today:  Echo 02/2019 1. The left ventricle has normal systolic function with an ejection fraction of 60-65%. The cavity size was normal. Left ventricular diastolic Doppler parameters are consistent with pseudonormalization.  2. The right ventricle has normal systolic function. The cavity was normal. There is no increase in right ventricular wall thickness.  3. The mitral valve is grossly normal.  4. The tricuspid valve is grossly normal.  5. The aortic valve is grossly normal. Aortic valve  regurgitation was not assessed by color flow Doppler.  6. The aortic root is normal in size and structure.  7. The interatrial septum is aneurysmal.  Calcium score 03/29/19 FINDINGS: Non-cardiac: See separate report from [facility].   Ascending aorta: Normal diameter 3.5 cm   Pericardium: Normal   Coronary arteries: Calcium noted in proximal/mid RCA and LAD   IMPRESSION: Coronary calcium score of 182. This was 54 rd percentile for age and sex matched control.   EKG:  No EKG today.  Recent Labs: 07/07/2018: ALT 18; BUN 11; Creatinine, Ser 0.74; Hemoglobin 13.6; Platelets 191; Potassium 2.8; Sodium 122  Recent  Lipid Panel    Component Value Date/Time   CHOL 243 (H) 06/15/2019 0821   TRIG 58 06/15/2019 0821   HDL 93 06/15/2019 0821   CHOLHDL 2.6 06/15/2019 0821   LDLCALC 140 (H) 06/15/2019 0821    Home Medications   Current Meds  Medication Sig  . acetaminophen (TYLENOL) 325 MG tablet Take 650 mg by mouth every 6 (six) hours as needed for mild pain or headache.  . ALPRAZolam (XANAX) 0.25 MG tablet Take 0.25 mg by mouth as needed.   . Ascorbic Acid (VITAMIN C) 1000 MG tablet Take 1,000 mg by mouth daily.  Marland Kitchen atorvastatin (LIPITOR) 10 MG tablet Take 1 tablet (10 mg total) by mouth daily.  . Biotin 10000 MCG TABS Take 1 tablet by mouth daily.  . Calcium Carbonate-Vitamin D (CALCIUM 600+D) 600-400 MG-UNIT per tablet Take 1 tablet by mouth daily.  . carboxymethylcellulose (REFRESH PLUS) 0.5 % SOLN Place 1 drop into both eyes daily as needed (dry eyes).  . cetirizine (ZYRTEC) 10 MG tablet Take 10 mg by mouth daily as needed for allergies.  . Cholecalciferol (VITAMIN D3 SUPER STRENGTH) 50 MCG (2000 UT) TABS Take 1 tablet by mouth daily.  . Cyanocobalamin 2500 MCG CHEW Chew 1 tablet by mouth daily.  Marland Kitchen losartan (COZAAR) 25 MG tablet Take 1 tablet (25 mg total) by mouth daily.  . Multiple Vitamin (MULTIVITAMIN) tablet Take 1 tablet by mouth daily.  . Omega 3-6-9 Fatty Acids (OMEGA 3-6-9 COMPLEX PO) Take 1 tablet by mouth daily.  . sodium chloride (OCEAN) 0.65 % SOLN nasal spray Place 1 spray into both nostrils as needed for congestion.      Review of Systems      Review of Systems  Constitution: Negative for chills, fever and malaise/fatigue.  Cardiovascular: Negative for chest pain, dyspnea on exertion, irregular heartbeat, leg swelling, near-syncope, orthopnea and palpitations.  Respiratory: Negative for cough, shortness of breath and wheezing.    All other systems reviewed and are otherwise negative except as noted above.  Physical Exam    VS:  BP (!) 158/95 (BP Location: Right Arm,  Patient Position: Sitting)   Pulse 75   Ht 5\' 7"  (1.702 m)   Wt 127 lb (57.6 kg)   BMI 19.89 kg/m  , BMI Body mass index is 19.89 kg/m. GEN: Well nourished, well developed, in no acute distress. HEENT: normal. Neck: Supple, no JVD, carotid bruits, or masses. Cardiac: RRR, no murmurs, rubs, or gallops. No clubbing, cyanosis, edema.  Radials/DP/PT 2+ and equal bilaterally.  Respiratory:  Respirations regular and unlabored, clear to auscultation bilaterally. GI: Soft, nontender, nondistended, BS + x 4. MS: No deformity or atrophy. Skin: Warm and dry, no rash. Neuro:  Strength and sensation are intact. Psych: Normal affect.   Assessment & Plan    1. CAD- CT calcium score 03/2019 with calcium score 182  which is 83rd percentile for age/sex.  Known family history of CAD in her father.  Start aspirin 81 mg daily.  Continue statin.  Discussed secondary prevention.  She has no anginal symptoms, exercises regularly without difficulty, no chest pain -no indication for ischemic evaluation at this time.  2. HTN-Home arm BP cuff checked today.  Found to be up 10 points lower than on manual check.  She will look at the manual at home and see if she can reprogram the cuff.  Blood pressure routinely at home 120s to 130s.  She will continue losartan 25 mg daily.  3. HLD- Started atorvastatin 10 mg last week for lipid profile 06/15/2019 with total cholesterol 243, HDL 93, triglycerides 58, LDL 140.  She will present for recheck of liver and lipid in 6 weeks.  Disposition: Follow up in 6 month(s) with Dr. Moshe Cipro, NP 06/22/2019, 12:45 PM

## 2019-06-22 NOTE — Patient Instructions (Signed)
Medication Instructions:  Your physician has recommended you make the following change in your medication:  START Aspirin EC 81 mg daily  If you notice bruising and cannot tolerate daily dosing, try three days per week.  *If you need a refill on your cardiac medications before your next appointment, please call your pharmacy*  Lab Work: Your physician recommends that you return for lab work in 6 weeks (Dec 7 - 11) to recheck your lipid profile and CMET. (Your CMET checks your electrolytes, liver function, kidneys - we check this as Atorvastatin has to filter through your liver)  If you have labs (blood work) drawn today and your tests are completely normal, you will receive your results only by: Marland Kitchen MyChart Message (if you have MyChart) OR . A paper copy in the mail If you have any lab test that is abnormal or we need to change your treatment, we will call you to review the results.  Testing/Procedures: No EKG today  Follow-Up: At [facility], you and your health needs are our priority.  As part of our continuing mission to provide you with exceptional heart care, we have created designated Provider Care Teams.  These Care Teams include your primary Cardiologist (physician) and Advanced Practice Providers (APPs -  Physician Assistants and Nurse Practitioners) who all work together to provide you with the care you need, when you need it.  Your next appointment:   6 months  The format for your next appointment:   In Person  Provider:   Jenne Campus, MD  Other Instructions  Goal of blood pressure less than 130/80

## 2019-09-07 DIAGNOSIS — E785 Hyperlipidemia, unspecified: Secondary | ICD-10-CM | POA: Diagnosis not present

## 2019-09-08 LAB — LIPID PANEL
Chol/HDL Ratio: 1.9 ratio (ref 0.0–4.4)
Cholesterol, Total: 216 mg/dL — ABNORMAL HIGH (ref 100–199)
HDL: 112 mg/dL (ref >39)
LDL Chol Calc (NIH): 91 mg/dL (ref 0–99)
Triglycerides: 78 mg/dL (ref 0–149)
VLDL Cholesterol Cal: 13 mg/dL (ref 5–40)

## 2019-09-08 LAB — ALT: ALT: 17 IU/L (ref 0–32)

## 2019-09-08 LAB — AST: AST: 24 IU/L (ref 0–40)

## 2019-09-15 ENCOUNTER — Other Ambulatory Visit: Payer: Self-pay

## 2019-09-15 ENCOUNTER — Encounter: Payer: Self-pay | Admitting: Family

## 2019-09-15 MED ORDER — ATORVASTATIN CALCIUM 10 MG PO TABS: 10.0000 mg | ORAL_TABLET | Freq: Every day | ORAL | 1 refills | Status: DC

## 2019-09-15 NOTE — Progress Notes (Signed)
Rx Sent to pharmacy.  

## 2019-09-22 DIAGNOSIS — I10 Essential (primary) hypertension: Secondary | ICD-10-CM | POA: Diagnosis not present

## 2019-09-22 DIAGNOSIS — R1310 Dysphagia, unspecified: Secondary | ICD-10-CM | POA: Diagnosis not present

## 2019-09-22 DIAGNOSIS — Z1389 Encounter for screening for other disorder: Secondary | ICD-10-CM | POA: Diagnosis not present

## 2019-09-22 DIAGNOSIS — Z0000 Encounter for general adult medical examination without abnormal findings: Secondary | ICD-10-CM | POA: Diagnosis not present

## 2019-09-25 ENCOUNTER — Other Ambulatory Visit: Payer: Self-pay | Admitting: Internal Medicine

## 2019-09-25 DIAGNOSIS — R1310 Dysphagia, unspecified: Secondary | ICD-10-CM

## 2019-10-02 DIAGNOSIS — Z0120 Encounter for dental examination and cleaning without abnormal findings: Secondary | ICD-10-CM | POA: Diagnosis not present

## 2019-10-16 ENCOUNTER — Ambulatory Visit
Admission: RE | Admit: 2019-10-16 | Discharge: 2019-10-16 | Disposition: A | Discharge status: Home / Self Care | Payer: Medicare Other | Source: Ambulatory Visit | Attending: Internal Medicine | Admitting: Internal Medicine

## 2019-10-16 DIAGNOSIS — K219 Gastro-esophageal reflux disease without esophagitis: Secondary | ICD-10-CM | POA: Diagnosis not present

## 2019-10-16 DIAGNOSIS — K224 Dyskinesia of esophagus: Secondary | ICD-10-CM | POA: Diagnosis not present

## 2019-10-16 DIAGNOSIS — R1310 Dysphagia, unspecified: Secondary | ICD-10-CM

## 2019-11-13 ENCOUNTER — Encounter: Payer: Self-pay | Admitting: Family

## 2019-11-13 DIAGNOSIS — I10 Essential (primary) hypertension: Secondary | ICD-10-CM

## 2019-11-13 MED ORDER — LOSARTAN POTASSIUM 25 MG PO TABS: 25.0000 mg | ORAL_TABLET | Freq: Every day | ORAL | 1 refills | Status: DC

## 2019-11-14 DIAGNOSIS — R0981 Nasal congestion: Secondary | ICD-10-CM | POA: Diagnosis not present

## 2019-12-08 DIAGNOSIS — K222 Esophageal obstruction: Secondary | ICD-10-CM | POA: Diagnosis not present

## 2019-12-08 DIAGNOSIS — R1310 Dysphagia, unspecified: Secondary | ICD-10-CM | POA: Diagnosis not present

## 2019-12-13 DIAGNOSIS — H18513 Endothelial corneal dystrophy, bilateral: Secondary | ICD-10-CM | POA: Diagnosis not present

## 2019-12-13 DIAGNOSIS — H2513 Age-related nuclear cataract, bilateral: Secondary | ICD-10-CM | POA: Diagnosis not present

## 2019-12-21 ENCOUNTER — Other Ambulatory Visit: Payer: Self-pay

## 2019-12-21 ENCOUNTER — Ambulatory Visit (INDEPENDENT_AMBULATORY_CARE_PROVIDER_SITE_OTHER): Payer: Medicare Other | Admitting: Podiatry

## 2019-12-21 VITALS — Temp 97.0°F

## 2019-12-21 DIAGNOSIS — M21619 Bunion of unspecified foot: Secondary | ICD-10-CM

## 2019-12-21 DIAGNOSIS — Q828 Other specified congenital malformations of skin: Secondary | ICD-10-CM

## 2019-12-22 NOTE — Progress Notes (Signed)
Subjective: 69 year old female presents the office today for concerns of painful calluses to both feet.  She has a painful corn between her first and second toes on the left foot with the first and second toes of been rubbing that she also has a callus on the right foot submetatarsal 5 which is painful with pressure in shoes.  Denies any open sores and denies any redness or drainage or any swelling.Denies any systemic complaints such as fevers, chills, nausea, vomiting. No acute changes since last appointment, and no other complaints at this time.   Objective: AAO x3, NAD DP/PT pulses palpable bilaterally, CRT less than 3 seconds Bunion deformities present bilaterally.  This is resulted in a corn along the IPJ of the hallux and second toe.  Upon debridement no ongoing ulceration drainage or signs of infection.  Also hyperkeratotic lesion right foot submetatarsal 5 without any ongoing ulceration drainage or signs of infection. No open lesions or pre-ulcerative lesions.  No pain with calf compression, swelling, warmth, erythema  Assessment: Hyperkeratotic lesion  Plan: -All treatment options discussed with the patient including all alternatives, risks, complications.  -Debrided hyperkeratotic lesions x3 without any complications or bleeding.  Continue offloading pads.  We did discuss surgical intervention for the bunions.  At some point if she continues to get painful skin lesions this may be helpful for her.  Moisturizer daily for the right foot. -Patient encouraged to call the office with any questions, concerns, change in symptoms.   Return if symptoms worsen or fail to improve.  Trula Slade DPM

## 2020-01-02 DIAGNOSIS — Z1159 Encounter for screening for other viral diseases: Secondary | ICD-10-CM | POA: Diagnosis not present

## 2020-01-04 DIAGNOSIS — K222 Esophageal obstruction: Secondary | ICD-10-CM | POA: Diagnosis not present

## 2020-01-04 DIAGNOSIS — R1310 Dysphagia, unspecified: Secondary | ICD-10-CM | POA: Diagnosis not present

## 2020-01-04 DIAGNOSIS — K3189 Other diseases of stomach and duodenum: Secondary | ICD-10-CM | POA: Diagnosis not present

## 2020-01-04 DIAGNOSIS — K228 Other specified diseases of esophagus: Secondary | ICD-10-CM | POA: Diagnosis not present

## 2020-01-04 DIAGNOSIS — K2930 Chronic superficial gastritis without bleeding: Secondary | ICD-10-CM | POA: Diagnosis not present

## 2020-01-04 DIAGNOSIS — R933 Abnormal findings on diagnostic imaging of other parts of digestive tract: Secondary | ICD-10-CM | POA: Diagnosis not present

## 2020-01-09 DIAGNOSIS — K2930 Chronic superficial gastritis without bleeding: Secondary | ICD-10-CM | POA: Diagnosis not present

## 2020-01-09 DIAGNOSIS — K228 Other specified diseases of esophagus: Secondary | ICD-10-CM | POA: Diagnosis not present

## 2020-03-12 DIAGNOSIS — Z1231 Encounter for screening mammogram for malignant neoplasm of breast: Secondary | ICD-10-CM | POA: Diagnosis not present

## 2020-03-12 DIAGNOSIS — Z01419 Encounter for gynecological examination (general) (routine) without abnormal findings: Secondary | ICD-10-CM | POA: Diagnosis not present

## 2020-03-12 DIAGNOSIS — M810 Age-related osteoporosis without current pathological fracture: Secondary | ICD-10-CM | POA: Diagnosis not present

## 2020-03-12 DIAGNOSIS — Z124 Encounter for screening for malignant neoplasm of cervix: Secondary | ICD-10-CM | POA: Diagnosis not present

## 2020-03-12 DIAGNOSIS — Z6820 Body mass index (BMI) 20.0-20.9, adult: Secondary | ICD-10-CM | POA: Diagnosis not present

## 2020-03-12 DIAGNOSIS — M818 Other osteoporosis without current pathological fracture: Secondary | ICD-10-CM | POA: Diagnosis not present

## 2020-04-03 ENCOUNTER — Other Ambulatory Visit: Payer: Self-pay

## 2020-04-03 ENCOUNTER — Telehealth: Payer: Self-pay

## 2020-04-03 MED ORDER — ATORVASTATIN CALCIUM 10 MG PO TABS: 10.0000 mg | ORAL_TABLET | Freq: Every day | ORAL | 0 refills | Status: DC

## 2020-04-03 NOTE — Progress Notes (Signed)
Refill sent in per request.  

## 2020-04-03 NOTE — Telephone Encounter (Signed)
Patient of Dr. Agustin Cree is scheduled to see Dr. Radford Pax on 08/13. Appointment note states that patient prefers to go to [facility] for [facility]. I do not see any record of both providers accepting the switch. Will route to both providers for approval.

## 2020-04-03 NOTE — Telephone Encounter (Signed)
Needs to be cleared by Dr. Drue Dun

## 2020-04-04 NOTE — Telephone Encounter (Signed)
Fine with me

## 2020-04-05 ENCOUNTER — Other Ambulatory Visit: Payer: Self-pay

## 2020-04-05 ENCOUNTER — Other Ambulatory Visit: Payer: Self-pay | Admitting: Family

## 2020-04-05 ENCOUNTER — Ambulatory Visit: Payer: Medicare Other | Admitting: Cardiology

## 2020-04-05 DIAGNOSIS — I10 Essential (primary) hypertension: Secondary | ICD-10-CM

## 2020-04-08 DIAGNOSIS — Z0120 Encounter for dental examination and cleaning without abnormal findings: Secondary | ICD-10-CM | POA: Diagnosis not present

## 2020-05-07 ENCOUNTER — Other Ambulatory Visit: Payer: Self-pay

## 2020-05-07 ENCOUNTER — Other Ambulatory Visit: Payer: Medicare Other

## 2020-05-07 DIAGNOSIS — Z20822 Contact with and (suspected) exposure to covid-19: Secondary | ICD-10-CM

## 2020-05-09 LAB — NOVEL CORONAVIRUS, NAA: SARS-CoV-2, NAA: NOT DETECTED

## 2020-05-09 LAB — SARS-COV-2, NAA 2 DAY TAT

## 2020-05-13 ENCOUNTER — Other Ambulatory Visit: Payer: Self-pay | Admitting: Cardiology

## 2020-05-29 ENCOUNTER — Ambulatory Visit: Payer: Medicare Other | Admitting: Cardiology

## 2020-06-06 DIAGNOSIS — Z23 Encounter for immunization: Secondary | ICD-10-CM | POA: Diagnosis not present

## 2020-06-27 ENCOUNTER — Ambulatory Visit: Payer: Medicare Other | Admitting: Cardiology

## 2020-06-27 ENCOUNTER — Other Ambulatory Visit: Payer: Self-pay

## 2020-06-27 ENCOUNTER — Encounter: Payer: Self-pay | Admitting: Cardiology

## 2020-06-27 VITALS — BP 162/100 | HR 68 | Ht 67.0 in | Wt 133.2 lb

## 2020-06-27 DIAGNOSIS — I10 Essential (primary) hypertension: Secondary | ICD-10-CM | POA: Diagnosis not present

## 2020-06-27 DIAGNOSIS — E7800 Pure hypercholesterolemia, unspecified: Secondary | ICD-10-CM | POA: Diagnosis not present

## 2020-06-27 DIAGNOSIS — R931 Abnormal findings on diagnostic imaging of heart and coronary circulation: Secondary | ICD-10-CM | POA: Diagnosis not present

## 2020-06-27 NOTE — Patient Instructions (Signed)
Medication Instructions:  Your physician recommends that you continue on your current medications as directed. Please refer to the Current Medication list given to you today.  *If you need a refill on your cardiac medications before your next appointment, please call your pharmacy*   Lab Work: Lipid, BMET and Liver function in January.  You will need to be fasting for these labs (nothing to eat or drink after midnight except water and black coffee).  If you have labs (blood work) drawn today and your tests are completely normal, you will receive your results only by: Marland Kitchen MyChart Message (if you have MyChart) OR . A paper copy in the mail If you have any lab test that is abnormal or we need to change your treatment, we will call you to review the results.   Testing/Procedures: None   Follow-Up: At [facility], you and your health needs are our priority.  As part of our continuing mission to provide you with exceptional heart care, we have created designated Provider Care Teams.  These Care Teams include your primary Cardiologist (physician) and Advanced Practice Providers (APPs -  Physician Assistants and Nurse Practitioners) who all work together to provide you with the care you need, when you need it.  We recommend signing up for the patient portal called "MyChart".  Sign up information is provided on this After Visit Summary.  MyChart is used to connect with patients for Virtual Visits (Telemedicine).  Patients are able to view lab/test results, encounter notes, upcoming appointments, etc.  Non-urgent messages can be sent to your provider as well.   To learn more about what you can do with MyChart, go to [email or web address].    Your next appointment:   12 month(s)  The format for your next appointment:   In Person  Provider:   You may see Fransico Him, MD or one of the following Advanced Practice Providers on your designated Care Team:    Melina Copa, PA-C  Ermalinda Barrios,  PA-C    Other Instructions

## 2020-06-27 NOTE — Addendum Note (Signed)
Addended by: Loren Racer on: 06/27/2020 02:18 PM   Modules accepted: Orders

## 2020-06-27 NOTE — Progress Notes (Signed)
Cardiology Office Note:    Date:  06/27/2020   ID:  Patricia [name], DOB 12/11/1950, MRN [id number]  PCP:  Seward Carol, MD  Cardiologist:  Fransico Him, MD    Referring MD: Seward Carol, MD   Chief Complaint  Patient presents with  . Follow-up    Coronary artery calcifications, HTN, HLD    History of Present Illness:    Patricia [name] is a 69 y.o. female with a hx of coronary artery calcifications by Chest CT with a Ca score of 182 placing her in the 84th percentile for age and sex matched controls.  When she saw Dr. Kirkland Hun last she was doing well, exercising 3-4 times weekly with a trainer with no anginal symptoms.  She has not had a stress test since she has been asymptomatic.  She also has HTN and HLD. She was started on ASA 81mg  daily and Atorvastatin 10mg  daily due to LDL 140.    She is now here for followup as she requested to switch Cardiac providers due to ease of location of our office to her home.She is here today for followup and is doing well.  She denies any chest pain or pressure, SOB, DOE, PND, orthopnea, LE edema, dizziness, palpitations or syncope. she is compliant with her meds and is tolerating meds with no SE.    Past Medical History:  Diagnosis Date  . Benign essential HTN 09/25/2014  . MR (mitral regurgitation)    mild by echo 2014 with no evidence of MVP  . Osteoporosis    tx w fosomax X5 years and boniva X2 currently not on treatment  . Patent foramen ovale   . 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 none      Current Medications: Current Meds  Medication Sig  . acetaminophen (TYLENOL) 325 MG tablet Take 650 mg by mouth every 6 (six) hours as needed for mild pain or headache.  . ALPRAZolam (XANAX) 0.25 MG tablet Take 0.25 mg by mouth as needed.   . Ascorbic Acid (VITAMIN C) 1000 MG tablet Take 1,000 mg by mouth daily.  Marland Kitchen aspirin EC 81 MG tablet Take 1 tablet (81 mg total) by mouth  daily.  Marland Kitchen atorvastatin (LIPITOR) 10 MG tablet TAKE 1 TABLET BY MOUTH DAILY  . Biotin 10000 MCG TABS Take 1 tablet by mouth daily.  . Calcium Carbonate-Vitamin D (CALCIUM 600+D) 600-400 MG-UNIT per tablet Take 1 tablet by mouth daily.  . carboxymethylcellulose (REFRESH PLUS) 0.5 % SOLN Place 1 drop into both eyes daily as needed (dry eyes).  . cetirizine (ZYRTEC) 10 MG tablet Take 10 mg by mouth daily as needed for allergies.  . Cholecalciferol (VITAMIN D3 SUPER STRENGTH) 50 MCG (2000 UT) TABS Take 1 tablet by mouth daily.  . Cyanocobalamin 2500 MCG CHEW Chew 1 tablet by mouth daily.  . fluticasone (FLONASE) 50 MCG/ACT nasal spray Place into both nostrils as needed for allergies or rhinitis.  Marland Kitchen GAVILYTE-G 236 g solution See admin instructions.  Marland Kitchen losartan (COZAAR) 25 MG tablet TAKE 1 TABLET(25 MG) BY MOUTH DAILY  . Multiple Vitamin (MULTIVITAMIN) tablet Take 1 tablet by mouth daily.  . Omega 3-6-9 Fatty Acids (OMEGA 3-6-9 COMPLEX PO) Take 1 tablet by mouth daily.  . sodium chloride (OCEAN) 0.65 % SOLN nasal spray Place 1 spray into both nostrils as needed for congestion.     Allergies:   Doxycycline   Social History  Socioeconomic History  . Marital status: Married    Spouse name: Not on file  . Number of children: Not on file  . Years of education: Not on file  . Highest education level: Not on file  Occupational History  . Not on file  Tobacco Use  . Smoking status: Never Smoker  . Smokeless tobacco: Never Used  Substance and Sexual Activity  . Alcohol use: Yes    Alcohol/week: 2.0 - 3.0 standard drinks    Types: 2 - 3 Glasses of wine per week  . Drug use: No  . Sexual activity: Not on file  Other Topics Concern  . Not on file  Social History Narrative  . Not on file   Social Determinants of Health   Financial Resource Strain:   . Difficulty of Paying Living Expenses: Not on file  Food Insecurity:   . Worried About Charity fundraiser in the Last Year: Not on file  .  Ran Out of Food in the Last Year: Not on file  Transportation Needs:   . Lack of Transportation (Medical): Not on file  . Lack of Transportation (Non-Medical): Not on file  Physical Activity:   . Days of Exercise per Week: Not on file  . Minutes of Exercise per Session: Not on file  Stress:   . Feeling of Stress : Not on file  Social Connections:   . Frequency of Communication with Friends and Family: Not on file  . Frequency of Social Gatherings with Friends and Family: Not on file  . Attends Religious Services: Not on file  . Active Member of Clubs or Organizations: Not on file  . Attends Archivist Meetings: Not on file  . Marital Status: Not on file     Family History: The patient's  family history includes COPD in her mother; Heart attack in her father.  ROS:   Please see the history of present illness.    ROS  All other systems reviewed and negative.   EKGs/Labs/Other Studies Reviewed:    The following studies were reviewed today:    EKG:  EKG is   ordered today.  The ekg ordered today demonstrates NSR with iRBBB  Recent Labs: 09/07/2019: ALT 17   Recent Lipid Panel    Component Value Date/Time   CHOL 216 (H) 09/07/2019 0838   TRIG 78 09/07/2019 0838   HDL 112 09/07/2019 0838   CHOLHDL 1.9 09/07/2019 0838   LDLCALC 91 09/07/2019 0838          Physical Exam:    VS:  BP (!) 162/100   Pulse 68   Ht 5\' 7"  (1.702 m)   Wt 133 lb 3.2 oz (60.4 kg)   SpO2 98%   BMI 20.86 kg/m     Wt Readings from Last 3 Encounters:  06/27/20 133 lb 3.2 oz (60.4 kg)  06/22/19 127 lb (57.6 kg)  04/20/19 128 lb 12.8 oz (58.4 kg)     GEN:   Well nourished, well developed in no acute distress HEENT: Normal NECK: No JVD; No carotid bruits LYMPHATICS: No lymphadenopathy CARDIAC: RRR, no murmurs, rubs, gallops RESPIRATORY:  Clear to auscultation without rales, wheezing or rhonchi  ABDOMEN: Soft, non-tender, non-distended MUSCULOSKELETAL:  No edema; No deformity   SKIN: Warm and dry NEUROLOGIC:  Alert and oriented x 3 PSYCHIATRIC:  Normal affect   ASSESSMENT:    1. Agatston coronary artery calcium score between 100 and 199   2. Benign essential HTN  3. Pure hypercholesterolemia    PLAN:    In order of problems listed above:  1.  Coronary artery calcium 100-200 -her score was 182 placing her in 83rd % for age and sex matched controls -2D echo with normal LVF 2020 -she is completely asymptomatic and therefore no indication for nuclear stress testing at this time -continue ASA and statin  2.  HTN -BP borderline controlled on exam today -continue Losartan 25mg  daily -SCr 0.87 in Jan 2021 -repeat BMET -I have asked her to check her BP daily at lunch for a week and call with results  3.  HLD -LDL was 140's -now on Atorva 10mg  daily -repeat FLP and ALT -LDL goal < 70   Medication Adjustments/Labs and Tests Ordered: Current medicines are reviewed at length with the patient today.  Concerns regarding medicines are outlined above.  Orders Placed This Encounter  Procedures  . EKG 12-Lead   No orders of the defined types were placed in this encounter.   Signed, Fransico Him, MD  06/27/2020 2:07 PM    [place]

## 2020-08-29 ENCOUNTER — Other Ambulatory Visit: Payer: Self-pay

## 2020-08-29 ENCOUNTER — Other Ambulatory Visit: Payer: Medicare Other

## 2020-08-29 DIAGNOSIS — E7800 Pure hypercholesterolemia, unspecified: Secondary | ICD-10-CM | POA: Diagnosis not present

## 2020-08-29 LAB — BASIC METABOLIC PANEL
BUN/Creatinine Ratio: 15 (ref 12–28)
BUN: 13 mg/dL (ref 8–27)
CO2: 23 mmol/L (ref 20–29)
Calcium: 9.5 mg/dL (ref 8.7–10.3)
Chloride: 102 mmol/L (ref 96–106)
Creatinine, Ser: 0.85 mg/dL (ref 0.57–1.00)
GFR calc Af Amer: 81 mL/min/{1.73_m2} (ref >59)
GFR calc non Af Amer: 70 mL/min/{1.73_m2} (ref >59)
Glucose: 93 mg/dL (ref 65–99)
Potassium: 4.3 mmol/L (ref 3.5–5.2)
Sodium: 139 mmol/L (ref 134–144)

## 2020-08-29 LAB — HEPATIC FUNCTION PANEL
ALT: 15 IU/L (ref 0–32)
AST: 19 IU/L (ref 0–40)
Albumin: 4.5 g/dL (ref 3.8–4.8)
Alkaline Phosphatase: 91 IU/L (ref 44–121)
Bilirubin Total: 0.4 mg/dL (ref 0.0–1.2)
Bilirubin, Direct: 0.12 mg/dL (ref 0.00–0.40)
Total Protein: 7.1 g/dL (ref 6.0–8.5)

## 2020-08-29 LAB — LIPID PANEL
Chol/HDL Ratio: 2.1 ratio (ref 0.0–4.4)
Cholesterol, Total: 204 mg/dL — ABNORMAL HIGH (ref 100–199)
HDL: 95 mg/dL (ref >39)
LDL Chol Calc (NIH): 96 mg/dL (ref 0–99)
Triglycerides: 71 mg/dL (ref 0–149)
VLDL Cholesterol Cal: 13 mg/dL (ref 5–40)

## 2020-08-30 ENCOUNTER — Telehealth: Payer: Self-pay

## 2020-08-30 DIAGNOSIS — E7800 Pure hypercholesterolemia, unspecified: Secondary | ICD-10-CM

## 2020-08-30 MED ORDER — ATORVASTATIN CALCIUM 20 MG PO TABS: 20.0000 mg | ORAL_TABLET | Freq: Every day | ORAL | 3 refills | Status: DC

## 2020-08-30 NOTE — Telephone Encounter (Signed)
-----   Message from Sueanne Margarita, MD sent at 08/30/2020 11:53 AM EST ----- Kidney and liver function are normal.  LDL is not at goal>>increase atorvastatin to 20mg  daily and repeat FLp and ALT in 6 weeks

## 2020-08-30 NOTE — Telephone Encounter (Signed)
The patient has been notified of the result and verbalized understanding.  All questions (if any) were answered. Antonieta Iba, RN 08/30/2020 4:10 PM  Atorvastatin 20 mg has been sent in and patient is scheduled for repeat lab work.

## 2020-10-22 ENCOUNTER — Other Ambulatory Visit: Payer: Medicare Other

## 2020-10-22 ENCOUNTER — Other Ambulatory Visit: Payer: Self-pay

## 2020-10-22 DIAGNOSIS — E7800 Pure hypercholesterolemia, unspecified: Secondary | ICD-10-CM

## 2020-10-22 LAB — LIPID PANEL
Chol/HDL Ratio: 2.1 ratio (ref 0.0–4.4)
Cholesterol, Total: 204 mg/dL — ABNORMAL HIGH (ref 100–199)
HDL: 95 mg/dL (ref >39)
LDL Chol Calc (NIH): 95 mg/dL (ref 0–99)
Triglycerides: 80 mg/dL (ref 0–149)
VLDL Cholesterol Cal: 14 mg/dL (ref 5–40)

## 2020-10-22 LAB — ALT: ALT: 14 IU/L (ref 0–32)

## 2020-10-24 ENCOUNTER — Telehealth: Payer: Self-pay | Admitting: Cardiology

## 2020-10-24 DIAGNOSIS — E7800 Pure hypercholesterolemia, unspecified: Secondary | ICD-10-CM

## 2020-10-24 MED ORDER — ATORVASTATIN CALCIUM 20 MG PO TABS: 20.0000 mg | ORAL_TABLET | Freq: Every day | ORAL | 3 refills | Status: DC

## 2020-10-24 NOTE — Telephone Encounter (Signed)
Patricia Margarita, MD  10/23/2020 7:56 AM EST      LDL not at goal - increase Atorvastatin to 20mg  daily and repeat FLP and ALT in 6 weeks   The patient has been notified of the result and verbalized understanding.  All questions (if any) were answered. Patricia Iba, RN 10/24/2020 5:03 PM

## 2020-10-24 NOTE — Telephone Encounter (Signed)
Patient is returning call in regards to echo's.

## 2020-10-25 DIAGNOSIS — I10 Essential (primary) hypertension: Secondary | ICD-10-CM | POA: Diagnosis not present

## 2020-10-25 DIAGNOSIS — E7800 Pure hypercholesterolemia, unspecified: Secondary | ICD-10-CM | POA: Diagnosis not present

## 2020-10-25 DIAGNOSIS — G4700 Insomnia, unspecified: Secondary | ICD-10-CM | POA: Diagnosis not present

## 2020-10-25 DIAGNOSIS — Z0000 Encounter for general adult medical examination without abnormal findings: Secondary | ICD-10-CM | POA: Diagnosis not present

## 2020-11-14 DIAGNOSIS — R413 Other amnesia: Secondary | ICD-10-CM | POA: Diagnosis not present

## 2020-11-26 DIAGNOSIS — M816 Localized osteoporosis [Lequesne]: Secondary | ICD-10-CM | POA: Diagnosis not present

## 2020-11-26 DIAGNOSIS — N958 Other specified menopausal and perimenopausal disorders: Secondary | ICD-10-CM | POA: Diagnosis not present

## 2020-12-05 ENCOUNTER — Other Ambulatory Visit: Payer: Medicare Other

## 2020-12-12 DIAGNOSIS — E7800 Pure hypercholesterolemia, unspecified: Secondary | ICD-10-CM | POA: Diagnosis not present

## 2020-12-12 DIAGNOSIS — R413 Other amnesia: Secondary | ICD-10-CM | POA: Diagnosis not present

## 2020-12-16 DIAGNOSIS — H18513 Endothelial corneal dystrophy, bilateral: Secondary | ICD-10-CM | POA: Diagnosis not present

## 2020-12-16 DIAGNOSIS — H2513 Age-related nuclear cataract, bilateral: Secondary | ICD-10-CM | POA: Diagnosis not present

## 2020-12-23 DIAGNOSIS — M810 Age-related osteoporosis without current pathological fracture: Secondary | ICD-10-CM | POA: Diagnosis not present

## 2021-01-23 DIAGNOSIS — E7800 Pure hypercholesterolemia, unspecified: Secondary | ICD-10-CM | POA: Diagnosis not present

## 2021-02-26 ENCOUNTER — Other Ambulatory Visit: Payer: Self-pay | Admitting: Cardiology

## 2021-02-26 DIAGNOSIS — I10 Essential (primary) hypertension: Secondary | ICD-10-CM

## 2021-02-27 ENCOUNTER — Other Ambulatory Visit: Payer: Self-pay | Admitting: Cardiology

## 2021-02-27 DIAGNOSIS — I10 Essential (primary) hypertension: Secondary | ICD-10-CM

## 2021-03-24 DIAGNOSIS — J069 Acute upper respiratory infection, unspecified: Secondary | ICD-10-CM | POA: Diagnosis not present

## 2021-04-06 ENCOUNTER — Other Ambulatory Visit: Payer: Self-pay | Admitting: Cardiology

## 2021-04-06 DIAGNOSIS — I10 Essential (primary) hypertension: Secondary | ICD-10-CM

## 2021-04-07 ENCOUNTER — Other Ambulatory Visit: Payer: Self-pay | Admitting: *Deleted

## 2021-04-07 DIAGNOSIS — Z6821 Body mass index (BMI) 21.0-21.9, adult: Secondary | ICD-10-CM | POA: Diagnosis not present

## 2021-04-07 DIAGNOSIS — Z01419 Encounter for gynecological examination (general) (routine) without abnormal findings: Secondary | ICD-10-CM | POA: Diagnosis not present

## 2021-04-07 DIAGNOSIS — Z1231 Encounter for screening mammogram for malignant neoplasm of breast: Secondary | ICD-10-CM | POA: Diagnosis not present

## 2021-04-07 DIAGNOSIS — I10 Essential (primary) hypertension: Secondary | ICD-10-CM

## 2021-04-07 DIAGNOSIS — Z124 Encounter for screening for malignant neoplasm of cervix: Secondary | ICD-10-CM | POA: Diagnosis not present

## 2021-04-07 MED ORDER — LOSARTAN POTASSIUM 25 MG PO TABS: ORAL_TABLET | ORAL | 11 refills | Status: DC

## 2021-04-15 DIAGNOSIS — R21 Rash and other nonspecific skin eruption: Secondary | ICD-10-CM | POA: Diagnosis not present

## 2021-04-22 DIAGNOSIS — B081 Molluscum contagiosum: Secondary | ICD-10-CM | POA: Diagnosis not present

## 2021-04-22 DIAGNOSIS — R21 Rash and other nonspecific skin eruption: Secondary | ICD-10-CM | POA: Diagnosis not present

## 2021-07-09 ENCOUNTER — Ambulatory Visit: Payer: Medicare Other | Admitting: Cardiology

## 2021-07-09 ENCOUNTER — Other Ambulatory Visit: Payer: Self-pay

## 2021-07-09 ENCOUNTER — Encounter: Payer: Self-pay | Admitting: Cardiology

## 2021-07-09 VITALS — BP 122/66 | HR 69 | Ht 67.0 in | Wt 128.8 lb

## 2021-07-09 DIAGNOSIS — I10 Essential (primary) hypertension: Secondary | ICD-10-CM | POA: Diagnosis not present

## 2021-07-09 DIAGNOSIS — E7800 Pure hypercholesterolemia, unspecified: Secondary | ICD-10-CM | POA: Diagnosis not present

## 2021-07-09 DIAGNOSIS — I2510 Atherosclerotic heart disease of native coronary artery without angina pectoris: Secondary | ICD-10-CM

## 2021-07-09 MED ORDER — ATORVASTATIN CALCIUM 20 MG PO TABS: 20.0000 mg | ORAL_TABLET | Freq: Every day | ORAL | 3 refills | Status: DC

## 2021-07-09 NOTE — Progress Notes (Signed)
Cardiology Office Note:    Date:  07/09/2021   ID:  Patricia [name], DOB 1950-10-20, MRN [id number]  PCP:  Seward Carol, MD  Cardiologist:  Fransico Him, MD    Referring MD: Seward Carol, MD   Chief Complaint  Patient presents with   Follow-up    Follow-up coronary artery calcifications hypertension and hyperlipidemia     History of Present Illness:    Patricia [name] is a 70 y.o. female with a hx of coronary artery calcifications by Chest CT with a Ca score of 182 placing her in the 84th percentile for age and sex matched controls.  When she saw Dr. Kirkland Hun last she was doing well, exercising 3-4 times weekly with a trainer with no anginal symptoms.  She has not had a stress test since she has been asymptomatic.  She also has HTN and HLD. She was started on ASA 81mg  daily and Atorvastatin 10mg  daily due to LDL 140.    She is here today for followup and is doing well.  She denies any chest pain or pressure, SOB, DOE, PND, orthopnea, LE edema, dizziness, palpitations or syncope. She tells me that she had been on atorvastatin and was changed to Crestor by Dr. Delfina Redwood due to ? of memory issues on liptor but she does not think she had any memory issues.  For the past 3 months she has been having cramps in her feet.    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   Ascorbic Acid (VITAMIN C) 1000 MG tablet Take 500 mg by mouth daily.   aspirin EC 81 MG tablet Take 1 tablet (81 mg total) by mouth  daily. (Patient taking differently: Take 81 mg by mouth as needed.)   Biotin 1000 MCG CHEW Take 1 tablet by mouth daily.   CALCIUM CARBONATE-VITAMIN D PO Take 1 tablet by mouth daily.   carboxymethylcellulose (REFRESH PLUS) 0.5 % SOLN Place 1 drop into both eyes daily as needed (dry eyes).   cetirizine (ZYRTEC) 10 MG tablet Take 10 mg by mouth daily as needed for allergies.   Cholecalciferol 50 MCG (2000 UT) TABS Take 1 tablet by mouth daily.   fluticasone (FLONASE) 50 MCG/ACT nasal spray Place into both nostrils as needed for allergies or rhinitis.   losartan (COZAAR) 25 MG tablet TAKE 1 TABLET(25 MG) BY MOUTH DAILY   Multiple Vitamin (MULTIVITAMIN) tablet Take 1 tablet by mouth daily.   Multiple Vitamins-Minerals (ZINC PO) Take 500 mg by mouth daily.   Omega 3-6-9 Fatty Acids (OMEGA 3-6-9 COMPLEX PO) Take 1 tablet by mouth daily.   rosuvastatin (CRESTOR) 5 MG tablet Take 5 mg by mouth daily.   sodium chloride (OCEAN) 0.65 % SOLN nasal spray Place 1 spray into both nostrils as needed for congestion.   vitamin B-12 (CYANOCOBALAMIN) 500 MCG tablet Chew 1 tablet by mouth daily.     Allergies:  Doxycycline   Social History   Socioeconomic History   Marital status: Married    Spouse name: Not on file   Number of children: Not on file   Years of education: Not on file   Highest education level: Not on file  Occupational History   Not on file  Tobacco Use   Smoking status: Never   Smokeless tobacco: Never  Substance and Sexual Activity   Alcohol use: Yes    Alcohol/week: 2.0 - 3.0 standard drinks    Types: 2 - 3 Glasses of wine per week   Drug use: No   Sexual activity: Not on file  Other Topics Concern   Not on file  Social History Narrative   Not on file   Social Determinants of Health   Financial Resource Strain: Not on file  Food Insecurity: Not on file  Transportation Needs: Not on file  Physical Activity: Not on file  Stress: Not on file  Social Connections: Not on  file     Family History: The patient's  family history includes COPD in her mother; Heart attack in her father.  ROS:   Please see the history of present illness.    ROS  All other systems reviewed and negative.   EKGs/Labs/Other Studies Reviewed:    The following studies were reviewed today:    EKG:  EKG is ordered today.  The ekg ordered today demonstrates NSR with iRBBB  Recent Labs: 08/29/2020: BUN 13; Creatinine, Ser 0.85; Potassium 4.3; Sodium 139 10/22/2020: ALT 14   Recent Lipid Panel    Component Value Date/Time   CHOL 204 (H) 10/22/2020 0807   TRIG 80 10/22/2020 0807   HDL 95 10/22/2020 0807   CHOLHDL 2.1 10/22/2020 0807   LDLCALC 95 10/22/2020 0807          Physical Exam:    VS:  BP 122/66   Pulse 69   Ht 5\' 7"  (1.702 m)   Wt 128 lb 12.8 oz (58.4 kg)   SpO2 97%   BMI 20.17 kg/m     Wt Readings from Last 3 Encounters:  07/09/21 128 lb 12.8 oz (58.4 kg)  06/27/20 133 lb 3.2 oz (60.4 kg)  06/22/19 127 lb (57.6 kg)   GEN: Well nourished, well developed in no acute distress HEENT: Normal NECK: No JVD; No carotid bruits LYMPHATICS: No lymphadenopathy CARDIAC:RRR, no murmurs, rubs, gallops RESPIRATORY:  Clear to auscultation without rales, wheezing or rhonchi  ABDOMEN: Soft, non-tender, non-distended MUSCULOSKELETAL:  No edema; No deformity  SKIN: Warm and dry NEUROLOGIC:  Alert and oriented x 3 PSYCHIATRIC:  Normal affect   ASSESSMENT:    1. Coronary artery disease involving native coronary artery of native heart without angina pectoris   2. Benign essential HTN   3. Pure hypercholesterolemia    PLAN:    In order of problems listed above:  1.  Coronary artery calcium 100-200 -her score was 182 placing her in 83rd % for age and sex matched controls -2D echo with normal LVF 2020 -She denies any anginal symptoms since I saw her last -she is completely asymptomatic and therefore no indication for nuclear stress testing at this time -continue  ASA and statin  2.  HTN -BP is adequately controlled on exam today -continue prescription drug management with losartan 25 mg daily with as needed refills -Check bmet  3.  HLD -LDL goal less than 70 -She was on Atorvastatin but had an episode of confusion and her husband was concerned  it was the statin and her PCP changed her to Crestor.  Now have foot cramps on Crestor for 63months. -chang Crestor back to Lipitor 20mg  daily -repeat FLP and ALT in 6 weeks  Medication Adjustments/Labs and Tests Ordered: Current medicines are reviewed at length with the patient today.  Concerns regarding medicines are outlined above.  Orders Placed This Encounter  Procedures   EKG 12-Lead    No orders of the defined types were placed in this encounter.   Signed, Fransico Him, MD  07/09/2021 2:49 PM    [place]

## 2021-07-09 NOTE — Addendum Note (Signed)
Addended by: Antonieta Iba on: 07/09/2021 02:55 PM   Modules accepted: Orders

## 2021-07-09 NOTE — Patient Instructions (Signed)
Medication Instructions:  Your physician has recommended you make the following change in your medication: 1) STOP taking Crestor (rosuvastatin) 2) START taking Lipitor (atorvastatin) 20 mg daily *If you need a refill on your cardiac medications before your next appointment, please call your pharmacy*   Lab Work: IN 6 WEEKS: CMET and FLP If you have labs (blood work) drawn today and your tests are completely normal, you will receive your results only by: [place] (if you have MyChart) OR A paper copy in the mail If you have any lab test that is abnormal or we need to change your treatment, we will call you to review the results.  Follow-Up: At [facility], you and your health needs are our priority.  As part of our continuing mission to provide you with exceptional heart care, we have created designated Provider Care Teams.  These Care Teams include your primary Cardiologist (physician) and Advanced Practice Providers (APPs -  Physician Assistants and Nurse Practitioners) who all work together to provide you with the care you need, when you need it.  Your next appointment:   1 year(s)  The format for your next appointment:   In Person  Provider:   Fransico Him, MD

## 2021-07-15 DIAGNOSIS — J0100 Acute maxillary sinusitis, unspecified: Secondary | ICD-10-CM | POA: Diagnosis not present

## 2021-08-21 ENCOUNTER — Other Ambulatory Visit: Payer: Medicare Other | Admitting: *Deleted

## 2021-08-21 ENCOUNTER — Other Ambulatory Visit: Payer: Self-pay

## 2021-08-21 DIAGNOSIS — E7800 Pure hypercholesterolemia, unspecified: Secondary | ICD-10-CM | POA: Diagnosis not present

## 2021-08-21 DIAGNOSIS — I10 Essential (primary) hypertension: Secondary | ICD-10-CM | POA: Diagnosis not present

## 2021-08-21 DIAGNOSIS — I2510 Atherosclerotic heart disease of native coronary artery without angina pectoris: Secondary | ICD-10-CM

## 2021-08-21 LAB — LIPID PANEL
Chol/HDL Ratio: 2 ratio (ref 0.0–4.4)
Cholesterol, Total: 191 mg/dL (ref 100–199)
HDL: 94 mg/dL (ref >39)
LDL Chol Calc (NIH): 83 mg/dL (ref 0–99)
Triglycerides: 81 mg/dL (ref 0–149)
VLDL Cholesterol Cal: 14 mg/dL (ref 5–40)

## 2021-08-21 LAB — COMPREHENSIVE METABOLIC PANEL
ALT: 21 IU/L (ref 0–32)
AST: 22 IU/L (ref 0–40)
Albumin/Globulin Ratio: 1.9 (ref 1.2–2.2)
Albumin: 4.3 g/dL (ref 3.8–4.8)
Alkaline Phosphatase: 61 IU/L (ref 44–121)
BUN/Creatinine Ratio: 21 (ref 12–28)
BUN: 18 mg/dL (ref 8–27)
Bilirubin Total: 0.3 mg/dL (ref 0.0–1.2)
CO2: 27 mmol/L (ref 20–29)
Calcium: 9.2 mg/dL (ref 8.7–10.3)
Chloride: 103 mmol/L (ref 96–106)
Creatinine, Ser: 0.86 mg/dL (ref 0.57–1.00)
Globulin, Total: 2.3 g/dL (ref 1.5–4.5)
Glucose: 88 mg/dL (ref 70–99)
Potassium: 4.8 mmol/L (ref 3.5–5.2)
Sodium: 140 mmol/L (ref 134–144)
Total Protein: 6.6 g/dL (ref 6.0–8.5)
eGFR: 73 mL/min/{1.73_m2} (ref >59)

## 2021-09-03 ENCOUNTER — Telehealth: Payer: Self-pay

## 2021-09-03 DIAGNOSIS — E7800 Pure hypercholesterolemia, unspecified: Secondary | ICD-10-CM

## 2021-09-03 DIAGNOSIS — I2510 Atherosclerotic heart disease of native coronary artery without angina pectoris: Secondary | ICD-10-CM

## 2021-09-03 MED ORDER — EZETIMIBE 10 MG PO TABS: 10.0000 mg | ORAL_TABLET | Freq: Every day | ORAL | 3 refills | Status: DC

## 2021-09-03 NOTE — Telephone Encounter (Signed)
-----   Message from Freada Bergeron, MD sent at 08/21/2021  6:08 PM EST ----- Her kidney function and liver function looks great. LDL cholesterol is a little above goal. Sounds like she did not do well on higher doses of lipitor or on crestor. Can we start zetia 10mg  daily and repeat FLP in 6-8 weeks?

## 2021-09-03 NOTE — Telephone Encounter (Signed)
The patient has been notified of the result and verbalized understanding.  All questions (if any) were answered. Antonieta Iba, RN 09/03/2021 3:30 PM  Rx for zetia has been sent in. Repeat labs have been scheduled.

## 2021-10-02 DIAGNOSIS — D225 Melanocytic nevi of trunk: Secondary | ICD-10-CM | POA: Diagnosis not present

## 2021-10-02 DIAGNOSIS — Z872 Personal history of diseases of the skin and subcutaneous tissue: Secondary | ICD-10-CM | POA: Diagnosis not present

## 2021-10-02 DIAGNOSIS — L821 Other seborrheic keratosis: Secondary | ICD-10-CM | POA: Diagnosis not present

## 2021-10-02 DIAGNOSIS — L814 Other melanin hyperpigmentation: Secondary | ICD-10-CM | POA: Diagnosis not present

## 2021-10-03 ENCOUNTER — Telehealth: Payer: Self-pay | Admitting: Cardiology

## 2021-10-03 DIAGNOSIS — E7800 Pure hypercholesterolemia, unspecified: Secondary | ICD-10-CM

## 2021-10-03 DIAGNOSIS — I10 Essential (primary) hypertension: Secondary | ICD-10-CM

## 2021-10-03 NOTE — Telephone Encounter (Signed)
Pt c/o medication issue:  1. Name of Medication: ezetimibe (ZETIA) 10 MG tablet  2. How are you currently taking this medication (dosage and times per day)? Take 1 tablet (10 mg total) by mouth daily  3. Are you having a reaction (difficulty breathing--STAT)? no  4. What is your medication issue? Patient says she stopping this medication because its causing her leg cramp

## 2021-10-03 NOTE — Telephone Encounter (Signed)
Returned call to patient who states that she was placed on Zetia in January by Dr Radford Pax. Pt calling to inform us that she is not taking this medication any more due to "terrible leg cramps" she has been experiencing over the last 2 weeks and states she has been awakened from sleep three times during the night by them. Pt states she IS still taking Atorvastatin 20mg  daily and having no issue with this medication-only since adding Zetia to it. Will forward to [place] as an [place].

## 2021-10-03 NOTE — Addendum Note (Signed)
Addended by: Ma Hillock on: 10/03/2021 05:08 PM   Modules accepted: Orders

## 2021-10-06 NOTE — Telephone Encounter (Signed)
Patient returning call. She states someone call her this morning.

## 2021-10-11 IMAGING — RF DG ESOPHAGUS
8 of 9 series · 14 of 24 positions shown · non-contrast
Comparison: None.

CLINICAL DATA: Chronic dysphagia with globus sensation in the lower
chest pain

EXAM:
ESOPHOGRAM / BARIUM SWALLOW / BARIUM TABLET STUDY
TECHNIQUE: Combined double contrast and single contrast examination performed
using effervescent crystals, thick barium liquid, and thin barium
liquid. The patient was observed with fluoroscopy swallowing a 13 mm
barium sulphate tablet.
FLUOROSCOPY TIME:  Fluoroscopy Time:  1 minutes 54 seconds
Radiation Exposure Index (if provided by the fluoroscopic device):
28 mGy
Number of Acquired Spot Images: 7

[Series 1: sequence · 2 of 17 frames shown (1 of 6)]
[frame 3/17]
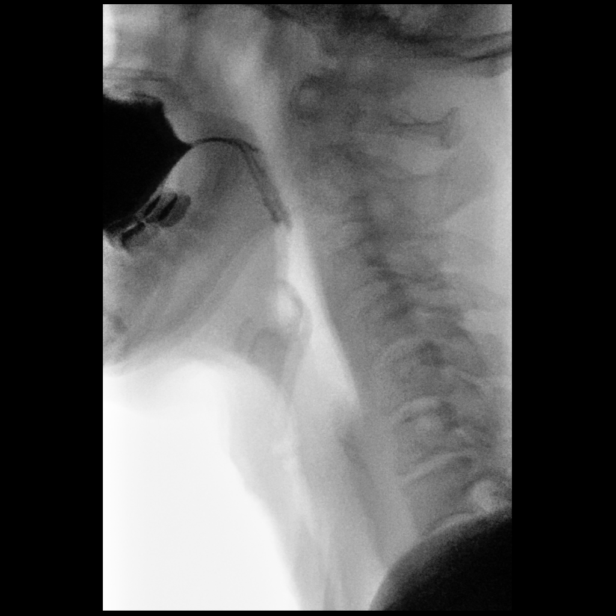
[frame 15/17]
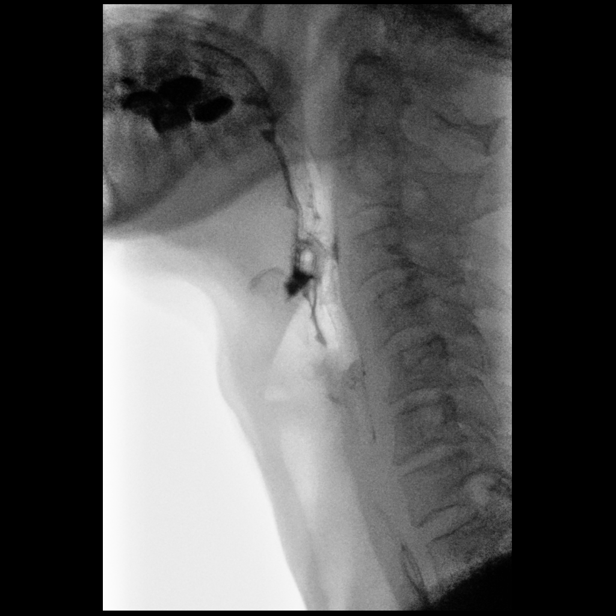

[Series 3: sequence · 2 of 10 frames shown (2 of 6)]
[frame 2/10]
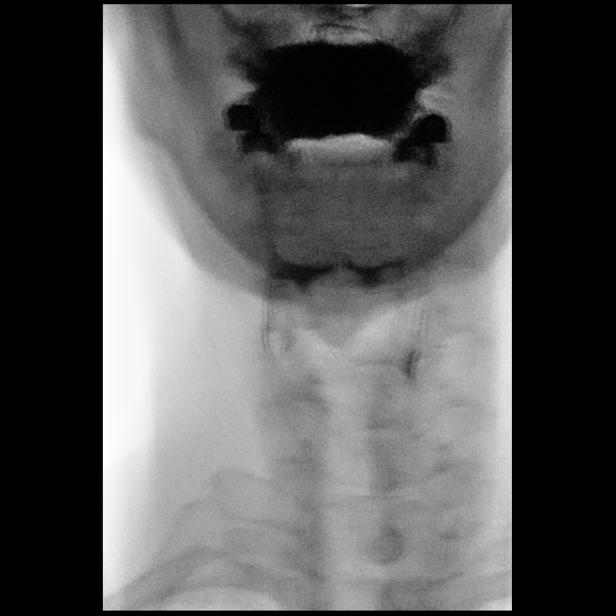
[frame 9/10]
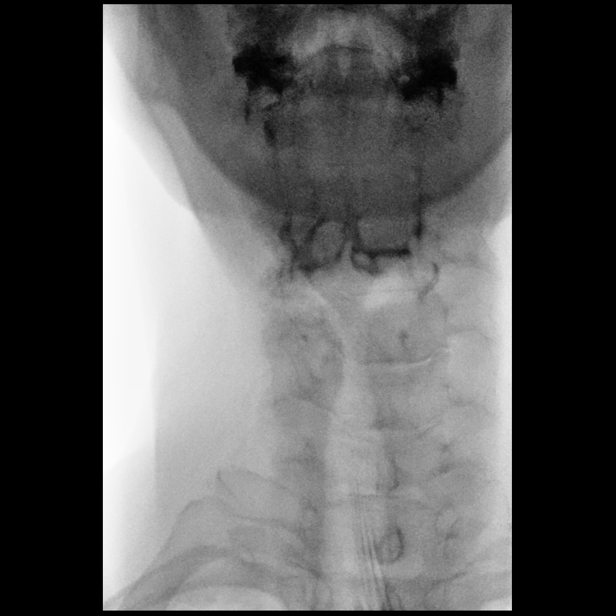

[Series 4: one shot · 0.15mm/px · 3 of 6 slices shown (1 of 2)]
[im 1/6]
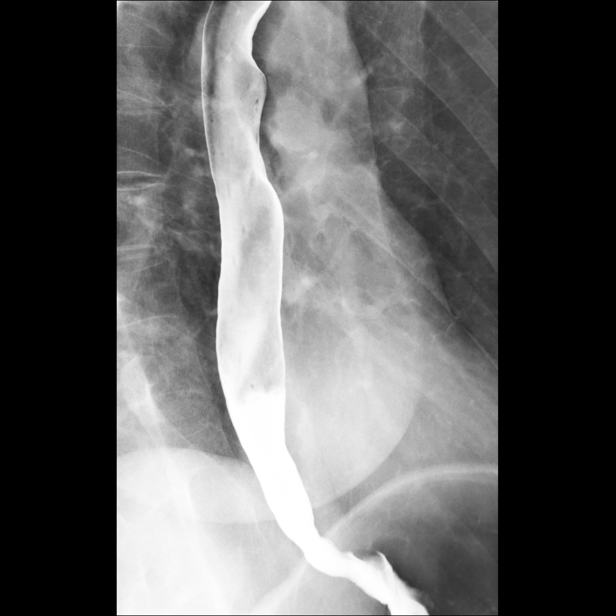
[im 4/6]
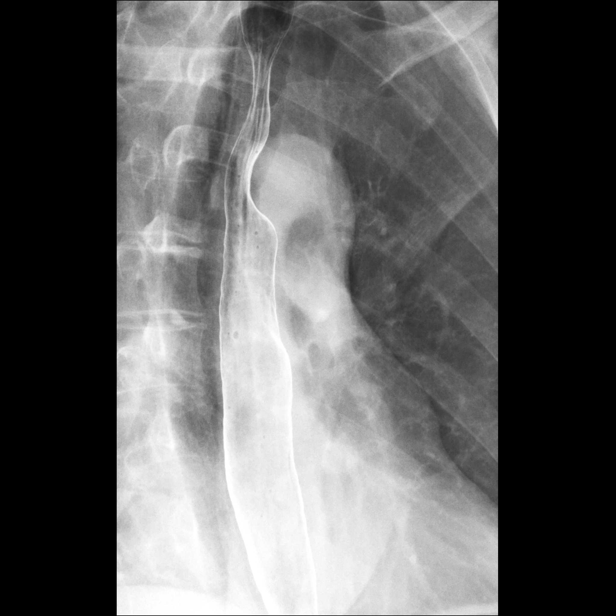
[im 6/6]
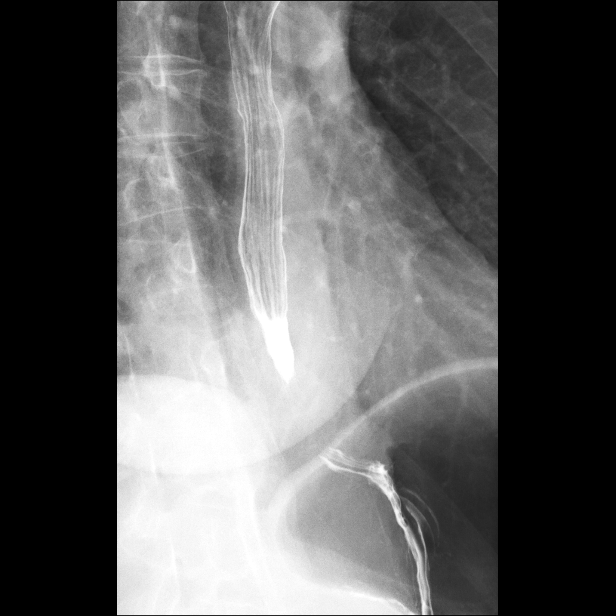

[Series 5: sequence · 2 of 27 frames shown (3 of 6)]
[frame 14/27]
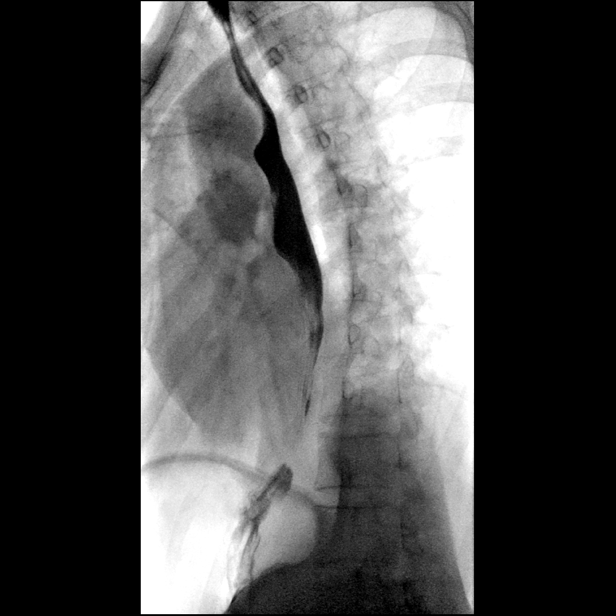
[frame 23/27]
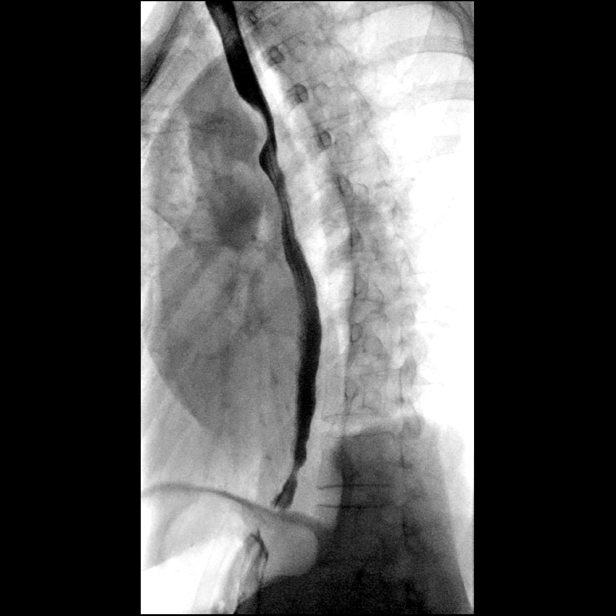

[Series 6: sequence · 1 of 83 frames shown (4 of 6)]
[frame 42/83]
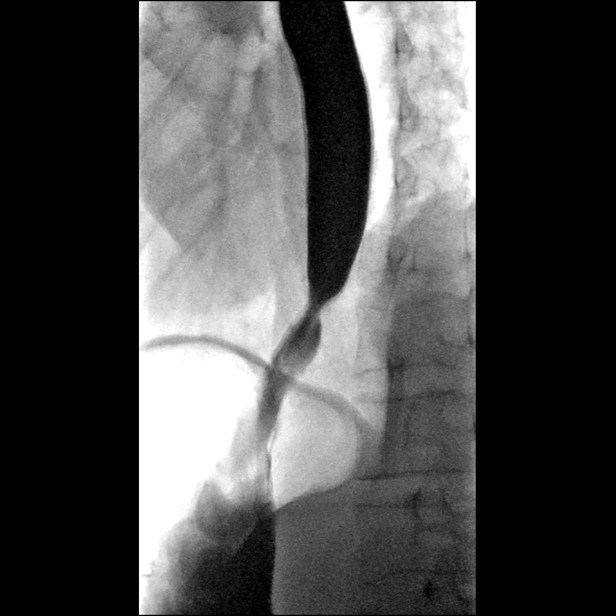

[Series 7: sequence · 2 of 8 frames shown (5 of 6)]
[frame 2/8]
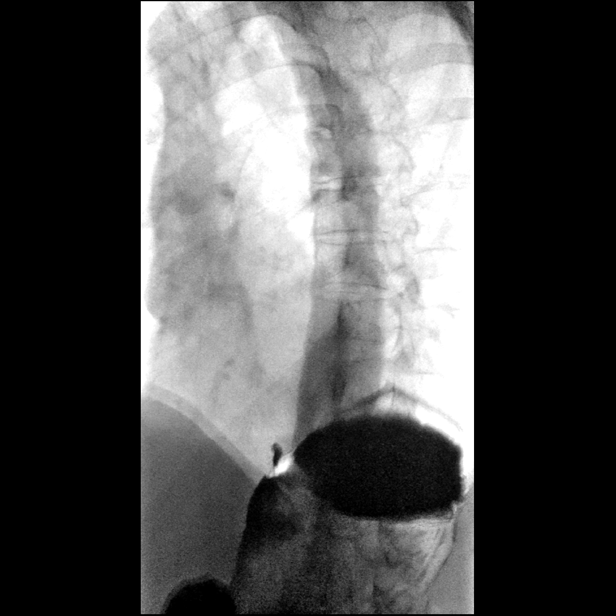
[frame 7/8]
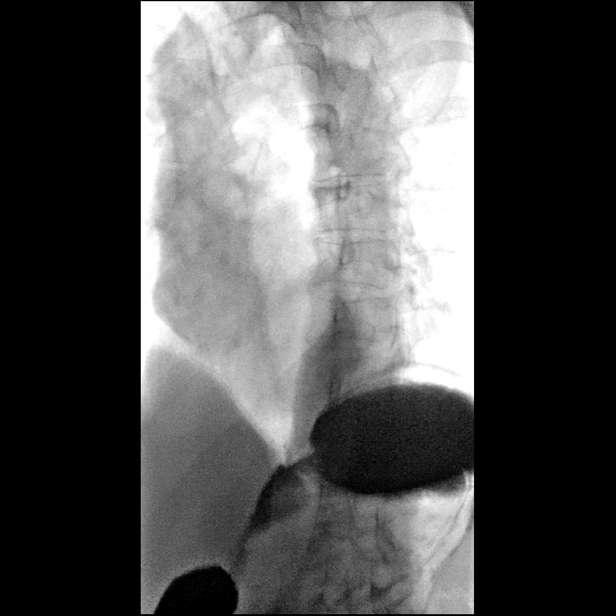

[Series 8: sequence · 1 of 52 frames shown (6 of 6)]
[frame 15/52]
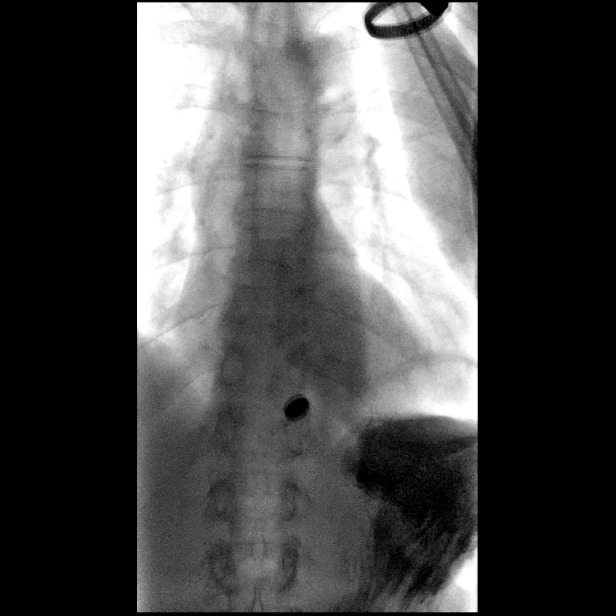

[Series 9: one shot · 1 of 1 slices shown (2 of 2)]
[im 1/1]
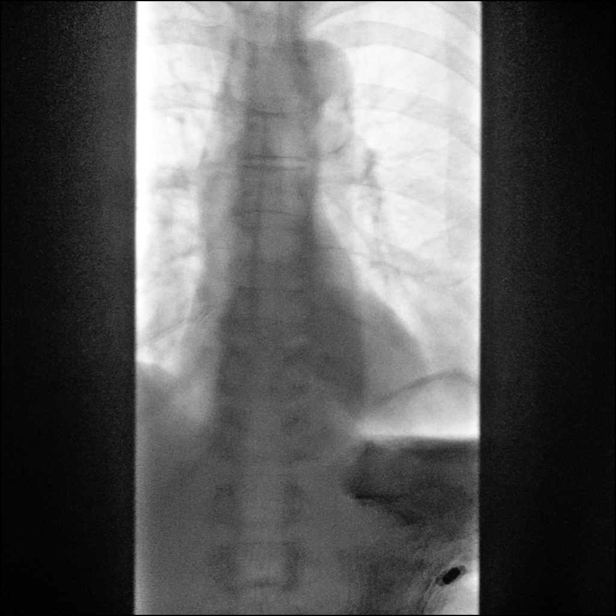

[14 of 24 positions shown; findings below may reference images not displayed]

FINDINGS: Oral and pharyngeal phases of swallowing are normal, with no
laryngeal penetration or tracheobronchial aspiration. No significant
barium retention in the pharynx. No evidence of pharyngeal mass,
stricture or diverticulum. No evidence of significant
cricopharyngeus muscle dysfunction.

Mild-to-moderate esophageal dysmotility, characterized by
intermittent weakening of primary peristalsis in the mid to lower
thoracic esophagus. Tiny sliding hiatal hernia. Mild
gastroesophageal reflux elicited to the level of the lower thoracic
esophagus with water siphon test. Esophageal mucosa is within normal
limits, with no evidence of reflux esophagitis. No evidence of
esophageal mass or ulcer. There is focal mild eccentric smooth
narrowing of the lower thoracic esophagus at the esophagogastric
junction, at which location the barium tablet became lodged for
several seconds before traversing into the stomach.
IMPRESSION: 1. Tiny sliding hiatal hernia. Mild gastroesophageal reflux
elicited.
2. Mild-to-moderate esophageal dysmotility, characteristic of
chronic reflux related dysmotility.
3. Suggestion of a mild Schatzki ring of borderline clinical
significance, see comments. No evidence of esophageal mass or ulcer.

## 2021-10-14 ENCOUNTER — Encounter: Payer: Self-pay | Admitting: Cardiology

## 2021-10-23 DIAGNOSIS — Z1152 Encounter for screening for COVID-19: Secondary | ICD-10-CM | POA: Diagnosis not present

## 2021-10-23 DIAGNOSIS — R0981 Nasal congestion: Secondary | ICD-10-CM | POA: Diagnosis not present

## 2021-10-23 DIAGNOSIS — Z03818 Encounter for observation for suspected exposure to other biological agents ruled out: Secondary | ICD-10-CM | POA: Diagnosis not present

## 2021-10-28 ENCOUNTER — Other Ambulatory Visit: Payer: Self-pay

## 2021-10-28 ENCOUNTER — Other Ambulatory Visit: Payer: Medicare Other | Admitting: *Deleted

## 2021-10-28 DIAGNOSIS — E7800 Pure hypercholesterolemia, unspecified: Secondary | ICD-10-CM | POA: Diagnosis not present

## 2021-10-28 DIAGNOSIS — I2510 Atherosclerotic heart disease of native coronary artery without angina pectoris: Secondary | ICD-10-CM

## 2021-10-28 LAB — LIPID PANEL
Chol/HDL Ratio: 2.1 ratio (ref 0.0–4.4)
Cholesterol, Total: 179 mg/dL (ref 100–199)
HDL: 84 mg/dL (ref >39)
LDL Chol Calc (NIH): 83 mg/dL (ref 0–99)
Triglycerides: 66 mg/dL (ref 0–149)
VLDL Cholesterol Cal: 12 mg/dL (ref 5–40)

## 2021-10-28 LAB — ALT: ALT: 24 IU/L (ref 0–32)

## 2021-11-05 ENCOUNTER — Other Ambulatory Visit: Payer: Self-pay

## 2021-11-05 ENCOUNTER — Ambulatory Visit: Payer: Medicare Other | Admitting: Pharmacist

## 2021-11-05 DIAGNOSIS — E7800 Pure hypercholesterolemia, unspecified: Secondary | ICD-10-CM

## 2021-11-05 MED ORDER — ATORVASTATIN CALCIUM 40 MG PO TABS: 40.0000 mg | ORAL_TABLET | Freq: Every day | ORAL | 3 refills | Status: DC

## 2021-11-05 NOTE — Patient Instructions (Addendum)
Your LDL cholesterol is 83 and your goal is < 70 ? ?Increase your atorvastatin to '40mg'$  once daily ? ?Recheck fasting cholesterol on Friday, April 28th any time after 7:15am ? ?

## 2021-11-05 NOTE — Progress Notes (Signed)
Patient ID: Patricia [name]                 DOB: 08-Apr-1951                    MRN: [id number] ? ? ? ? ?HPI: ?Patricia [name] is a 71 y.o. female patient referred to lipid clinic by Dr Radford Pax. PMH is significant for coronary artery calcifications by chest CT with calcium score of 182 (84th percentile for age and sex matched controls), HTN, and HLD. She reported cramping in her feet on rosuvastatin at last visit with MD 06/2021 and was changed back to atorvastatin. Follow up LDL was 83 and she was started on ezetimibe. Pt discontinued ezetimibe secondary to leg cramping and was referred to lipid clinic. ? ?Pt presents today for follow up. Reports tolerating atorvastatin '20mg'$  daily well, has not tried higher doses in the past. Did experience cramping ini her feet on rosuvastatin monotherapy, and cramping in her calves on ezetimibe when it was added to her atorvastatin. Cramping took about 3 weeks to appear and resolved almost immediately once the medication was stopped. She has also stopped taking her baby aspirin due to it causing frequent bruising. ? ?Current Medications: atorvastatin '20mg'$  daily ?Intolerances: rosuvastatin - cramping in her feet, atorvastatin - episode of confusion, ezetimibe - calf cramping ?Risk Factors: elevated calcium score, age, HTN ?LDL goal: '70mg'$ /dL ? ?Diet: Chicken, veggies, fruit.  ? ?Exercise: exercises 3-4x weekly with a trainer ? ?Family History: COPD in her mother; Heart attack in her father. ? ?Social History: 2-3 glasses of wine per week, no drug or tobacco use. ? ?Labs: ?10/28/21: TC 179, TG 66, HDL 84, LDL 83 (atorvastatin '20mg'$  daily) ?08/21/21: TC 191, TG 81, HDL 94, LDL 83 (atorvastatin '20mg'$  daily) ? ?Past Medical History:  ?Diagnosis Date  ? Benign essential HTN 09/25/2014  ? MR (mitral regurgitation)   ? mild by echo 2014 with no evidence of MVP  ? Osteoporosis   ? tx w fosomax X5 years and boniva X2 currently not on treatment  ? Patent foramen ovale   ? Skin cancer 2002  ? skin cancer  basal cell on neck  ? ? ?Current Outpatient Medications on File Prior to Visit  ?Medication Sig Dispense Refill  ? acetaminophen (TYLENOL) 325 MG tablet Take 650 mg by mouth every 6 (six) hours as needed for mild pain or headache.    ? ALPRAZolam (XANAX) 0.25 MG tablet Take 0.25 mg by mouth as needed.  (Patient not taking: Reported on 07/09/2021)    ? Ascorbic Acid (VITAMIN C) 1000 MG tablet Take 500 mg by mouth daily.    ? aspirin EC 81 MG tablet Take 1 tablet (81 mg total) by mouth daily. (Patient taking differently: Take 81 mg by mouth as needed.) 90 tablet 3  ? atorvastatin (LIPITOR) 20 MG tablet Take 1 tablet (20 mg total) by mouth daily. 90 tablet 3  ? Biotin 1000 MCG CHEW Take 1 tablet by mouth daily.    ? CALCIUM CARBONATE-VITAMIN D PO Take 1 tablet by mouth daily.    ? carboxymethylcellulose (REFRESH PLUS) 0.5 % SOLN Place 1 drop into both eyes daily as needed (dry eyes).    ? cetirizine (ZYRTEC) 10 MG tablet Take 10 mg by mouth daily as needed for allergies.    ? Cholecalciferol 50 MCG (2000 UT) TABS Take 1 tablet by mouth daily.    ? ezetimibe (ZETIA) 10 MG tablet Take 1 tablet (10 mg total) by  mouth daily. 90 tablet 3  ? fluticasone (FLONASE) 50 MCG/ACT nasal spray Place into both nostrils as needed for allergies or rhinitis.    ? GAVILYTE-G 236 g solution See admin instructions. (Patient not taking: Reported on 07/09/2021)    ? losartan (COZAAR) 25 MG tablet TAKE 1 TABLET(25 MG) BY MOUTH DAILY 30 tablet 11  ? Multiple Vitamin (MULTIVITAMIN) tablet Take 1 tablet by mouth daily.    ? Multiple Vitamins-Minerals (ZINC PO) Take 500 mg by mouth daily.    ? Omega 3-6-9 Fatty Acids (OMEGA 3-6-9 COMPLEX PO) Take 1 tablet by mouth daily.    ? sodium chloride (OCEAN) 0.65 % SOLN nasal spray Place 1 spray into both nostrils as needed for congestion.    ? vitamin B-12 (CYANOCOBALAMIN) 500 MCG tablet Chew 1 tablet by mouth daily.    ? ?No current facility-administered medications on file prior to visit.   ? ? ?Allergies  ?Allergen Reactions  ? Doxycycline Nausea And Vomiting  ?  Became Dehydrated  ? ? ?Assessment/Plan: ? ?1. Hyperlipidemia - LDL 83 on atorvastatin '20mg'$  daily, above goal < 70 given elevated CAC. Intolerant to rosuvastatin and ezetimibe. Discussed increasing dose of atorvastatin or adding on Nexletol or [id number]. Pt prefers to increase her atorvastatin dose to '40mg'$  daily, new rx has been sent in. Will recheck labs in 6 weeks, pt aware to call with any tolerability issues before then. Pt already staying very active and eating a heart healthy diet. ? ?She has also stopped her baby aspirin due to frequent bruising. Ideally prefer pt to continue given her elevated CAC. She is agreeable to try taking it every other day to see if she tolerates it better. ? ?Patricia [name], PharmD, BCACP, CPP ?[place] [place], [place] [address] ?Phone: [phone]; Fax: [phone] ?11/05/2021 3:16 PM ? ? ? ?

## 2021-11-10 DIAGNOSIS — I2510 Atherosclerotic heart disease of native coronary artery without angina pectoris: Secondary | ICD-10-CM | POA: Diagnosis not present

## 2021-11-10 DIAGNOSIS — M810 Age-related osteoporosis without current pathological fracture: Secondary | ICD-10-CM | POA: Diagnosis not present

## 2021-11-10 DIAGNOSIS — Z5181 Encounter for therapeutic drug level monitoring: Secondary | ICD-10-CM | POA: Diagnosis not present

## 2021-11-10 DIAGNOSIS — E7800 Pure hypercholesterolemia, unspecified: Secondary | ICD-10-CM | POA: Diagnosis not present

## 2021-11-10 DIAGNOSIS — I10 Essential (primary) hypertension: Secondary | ICD-10-CM | POA: Diagnosis not present

## 2021-11-10 DIAGNOSIS — Z0000 Encounter for general adult medical examination without abnormal findings: Secondary | ICD-10-CM | POA: Diagnosis not present

## 2021-11-13 ENCOUNTER — Other Ambulatory Visit: Payer: Self-pay | Admitting: Obstetrics and Gynecology

## 2021-11-13 ENCOUNTER — Ambulatory Visit
Admission: RE | Admit: 2021-11-13 | Discharge: 2021-11-13 | Disposition: A | Discharge status: Home / Self Care | Payer: Medicare Other | Source: Ambulatory Visit | Attending: Obstetrics and Gynecology | Admitting: Obstetrics and Gynecology

## 2021-11-13 DIAGNOSIS — N6310 Unspecified lump in the right breast, unspecified quadrant: Secondary | ICD-10-CM

## 2021-11-13 DIAGNOSIS — N644 Mastodynia: Secondary | ICD-10-CM

## 2021-11-13 DIAGNOSIS — N6489 Other specified disorders of breast: Secondary | ICD-10-CM | POA: Diagnosis not present

## 2021-11-13 DIAGNOSIS — R922 Inconclusive mammogram: Secondary | ICD-10-CM | POA: Diagnosis not present

## 2021-12-09 ENCOUNTER — Encounter: Payer: Self-pay | Admitting: Cardiology

## 2021-12-10 MED ORDER — ATORVASTATIN CALCIUM 20 MG PO TABS: 20.0000 mg | ORAL_TABLET | Freq: Every day | ORAL | 5 refills | Status: DC

## 2021-12-17 DIAGNOSIS — H18513 Endothelial corneal dystrophy, bilateral: Secondary | ICD-10-CM | POA: Diagnosis not present

## 2021-12-17 DIAGNOSIS — H2513 Age-related nuclear cataract, bilateral: Secondary | ICD-10-CM | POA: Diagnosis not present

## 2021-12-19 ENCOUNTER — Other Ambulatory Visit: Payer: Medicare Other

## 2022-01-16 ENCOUNTER — Other Ambulatory Visit: Payer: Self-pay | Admitting: Internal Medicine

## 2022-01-16 DIAGNOSIS — R109 Unspecified abdominal pain: Secondary | ICD-10-CM | POA: Diagnosis not present

## 2022-01-16 DIAGNOSIS — E7800 Pure hypercholesterolemia, unspecified: Secondary | ICD-10-CM | POA: Diagnosis not present

## 2022-02-20 ENCOUNTER — Ambulatory Visit: Payer: Medicare Other | Admitting: Podiatry

## 2022-02-20 DIAGNOSIS — B070 Plantar wart: Secondary | ICD-10-CM | POA: Diagnosis not present

## 2022-02-20 DIAGNOSIS — R52 Pain, unspecified: Secondary | ICD-10-CM | POA: Diagnosis not present

## 2022-02-20 NOTE — Progress Notes (Signed)
Subjective: 71 year old female presents the office today for new complaints of pain of painful lesions on her left foot.  She does get some discomfort with walking.  She does the gym 4 to 5 days a week which aggravates her symptoms.  She still uses a toe separator between her big toe and second toe which has been very helpful for her.  No recent treatment or injuries.  Complaints of any foreign objects.  No other concerns  Objective: AAO x3, NAD DP/PT pulses palpable bilaterally, CRT less than 3 seconds On the plantar aspect left foot submetatarsal 5 or 2 punctate annular hyperkeratotic lesions.  There is also 1 small lesion submetatarsal 2.  No underlying ulceration drainage or evidence of foreign body.   Bunion present.   No pain with calf compression, swelling, warmth, erythema  Assessment: Skin lesion, likely verruca left foot  Plan: -All treatment options discussed with the patient including all alternatives, risks, complications.  -Sharp debrided the skin lesions x2 without any complications or bleeding to the fifth MPJ plantarly.  Skin with alcohol and Cantharone Plus was applied followed by an occlusive bandage.  Postprocedure instructions discussed.  Monitor for any signs or symptoms of infection -Continue toe separator. -Patient encouraged to call the office with any questions, concerns, change in symptoms.   Return if symptoms worsen or fail to improve.  Trula Slade DPM

## 2022-02-20 NOTE — Patient Instructions (Signed)
Take dressing off in 8 hours and wash the foot with soap and water. If it is hurting or becomes uncomfortable before the 8 hours, go ahead and remove the bandage and wash the area.  If it blisters, apply antibiotic ointment and a band-aid.  Monitor for any signs/symptoms of infection. Call the office immediately if any occur or go directly to the emergency room. Call with any questions/concerns.   

## 2022-03-09 ENCOUNTER — Other Ambulatory Visit: Payer: Self-pay | Admitting: Cardiology

## 2022-03-09 DIAGNOSIS — I10 Essential (primary) hypertension: Secondary | ICD-10-CM

## 2022-04-09 ENCOUNTER — Ambulatory Visit
Admission: RE | Admit: 2022-04-09 | Discharge: 2022-04-09 | Disposition: A | Discharge status: Home / Self Care | Payer: Medicare Other | Source: Ambulatory Visit | Attending: Obstetrics and Gynecology | Admitting: Obstetrics and Gynecology

## 2022-04-09 DIAGNOSIS — N6310 Unspecified lump in the right breast, unspecified quadrant: Secondary | ICD-10-CM

## 2022-04-09 DIAGNOSIS — N6489 Other specified disorders of breast: Secondary | ICD-10-CM | POA: Diagnosis not present

## 2022-04-09 DIAGNOSIS — R922 Inconclusive mammogram: Secondary | ICD-10-CM | POA: Diagnosis not present

## 2022-04-09 DIAGNOSIS — N644 Mastodynia: Secondary | ICD-10-CM

## 2022-04-10 DIAGNOSIS — Z01419 Encounter for gynecological examination (general) (routine) without abnormal findings: Secondary | ICD-10-CM | POA: Diagnosis not present

## 2022-04-10 DIAGNOSIS — Z6820 Body mass index (BMI) 20.0-20.9, adult: Secondary | ICD-10-CM | POA: Diagnosis not present

## 2022-06-06 DIAGNOSIS — Z23 Encounter for immunization: Secondary | ICD-10-CM | POA: Diagnosis not present

## 2022-07-14 DIAGNOSIS — J069 Acute upper respiratory infection, unspecified: Secondary | ICD-10-CM | POA: Diagnosis not present

## 2022-07-21 DIAGNOSIS — J069 Acute upper respiratory infection, unspecified: Secondary | ICD-10-CM | POA: Diagnosis not present

## 2022-07-27 DIAGNOSIS — L989 Disorder of the skin and subcutaneous tissue, unspecified: Secondary | ICD-10-CM | POA: Diagnosis not present

## 2022-07-27 DIAGNOSIS — D492 Neoplasm of unspecified behavior of bone, soft tissue, and skin: Secondary | ICD-10-CM | POA: Diagnosis not present

## 2022-08-03 ENCOUNTER — Ambulatory Visit: Payer: Medicare Other | Admitting: Podiatry

## 2022-08-03 DIAGNOSIS — M7672 Peroneal tendinitis, left leg: Secondary | ICD-10-CM

## 2022-08-03 DIAGNOSIS — M722 Plantar fascial fibromatosis: Secondary | ICD-10-CM

## 2022-08-03 MED ORDER — MELOXICAM 7.5 MG PO TABS: 7.5000 mg | ORAL_TABLET | Freq: Every day | ORAL | 0 refills | Status: DC

## 2022-08-03 NOTE — Patient Instructions (Signed)
For inserts I like powersteps, superfeet, aetrex  Plantar Fasciitis (Heel Spur Syndrome) with Rehab The plantar fascia is a fibrous, ligament-like, soft-tissue structure that spans the bottom of the foot. Plantar fasciitis is a condition that causes pain in the foot due to inflammation of the tissue. SYMPTOMS  Pain and tenderness on the underneath side of the foot. Pain that worsens with standing or walking. CAUSES  Plantar fasciitis is caused by irritation and injury to the plantar fascia on the underneath side of the foot. Common mechanisms of injury include: Direct trauma to bottom of the foot. Damage to a small nerve that runs under the foot where the main fascia attaches to the heel bone. Stress placed on the plantar fascia due to bone spurs. RISK INCREASES WITH:  Activities that place stress on the plantar fascia (running, jumping, pivoting, or cutting). Poor strength and flexibility. Improperly fitted shoes. Tight calf muscles. Flat feet. Failure to warm-up properly before activity. Obesity. PREVENTION Warm up and stretch properly before activity. Allow for adequate recovery between workouts. Maintain physical fitness: Strength, flexibility, and endurance. Cardiovascular fitness. Maintain a health body weight. Avoid stress on the plantar fascia. Wear properly fitted shoes, including arch supports for individuals who have flat feet.  PROGNOSIS  If treated properly, then the symptoms of plantar fasciitis usually resolve without surgery. However, occasionally surgery is necessary.  RELATED COMPLICATIONS  Recurrent symptoms that may result in a chronic condition. Problems of the lower back that are caused by compensating for the injury, such as limping. Pain or weakness of the foot during push-off following surgery. Chronic inflammation, scarring, and partial or complete fascia tear, occurring more often from repeated injections.  TREATMENT  Treatment initially involves  the use of ice and medication to help reduce pain and inflammation. The use of strengthening and stretching exercises may help reduce pain with activity, especially stretches of the Achilles tendon. These exercises may be performed at home or with a therapist. Your caregiver may recommend that you use heel cups of arch supports to help reduce stress on the plantar fascia. Occasionally, corticosteroid injections are given to reduce inflammation. If symptoms persist for greater than 6 months despite non-surgical (conservative), then surgery may be recommended.   MEDICATION  If pain medication is necessary, then nonsteroidal anti-inflammatory medications, such as aspirin and ibuprofen, or other minor pain relievers, such as acetaminophen, are often recommended. Do not take pain medication within 7 days before surgery. Prescription pain relievers may be given if deemed necessary by your caregiver. Use only as directed and only as much as you need. Corticosteroid injections may be given by your caregiver. These injections should be reserved for the most serious cases, because they may only be given a certain number of times.  HEAT AND COLD Cold treatment (icing) relieves pain and reduces inflammation. Cold treatment should be applied for 10 to 15 minutes every 2 to 3 hours for inflammation and pain and immediately after any activity that aggravates your symptoms. Use ice packs or massage the area with a piece of ice (ice massage). Heat treatment may be used prior to performing the stretching and strengthening activities prescribed by your caregiver, physical therapist, or athletic trainer. Use a heat pack or soak the injury in warm water.  SEEK IMMEDIATE MEDICAL CARE IF: Treatment seems to offer no benefit, or the condition worsens. Any medications produce adverse side effects.  EXERCISES- RANGE OF MOTION (ROM) AND STRETCHING EXERCISES - Plantar Fasciitis (Heel Spur Syndrome) These exercises may help  you when beginning to rehabilitate your injury. Your symptoms may resolve with or without further involvement from your physician, physical therapist or athletic trainer. While completing these exercises, remember:  Restoring tissue flexibility helps normal motion to return to the joints. This allows healthier, less painful movement and activity. An effective stretch should be held for at least 30 seconds. A stretch should never be painful. You should only feel a gentle lengthening or release in the stretched tissue.  RANGE OF MOTION - Toe Extension, Flexion Sit with your right / left leg crossed over your opposite knee. Grasp your toes and gently pull them back toward the top of your foot. You should feel a stretch on the bottom of your toes and/or foot. Hold this stretch for 10 seconds. Now, gently pull your toes toward the bottom of your foot. You should feel a stretch on the top of your toes and or foot. Hold this stretch for 10 seconds. Repeat  times. Complete this stretch 3 times per day.   RANGE OF MOTION - Ankle Dorsiflexion, Active Assisted Remove shoes and sit on a chair that is preferably not on a carpeted surface. Place right / left foot under knee. Extend your opposite leg for support. Keeping your heel down, slide your right / left foot back toward the chair until you feel a stretch at your ankle or calf. If you do not feel a stretch, slide your bottom forward to the edge of the chair, while still keeping your heel down. Hold this stretch for 10 seconds. Repeat 3 times. Complete this stretch 2 times per day.   STRETCH  Gastroc, Standing Place hands on wall. Extend right / left leg, keeping the front knee somewhat bent. Slightly point your toes inward on your back foot. Keeping your right / left heel on the floor and your knee straight, shift your weight toward the wall, not allowing your back to arch. You should feel a gentle stretch in the right / left calf. Hold this position  for 10 seconds. Repeat 3 times. Complete this stretch 2 times per day.  STRETCH  Soleus, Standing Place hands on wall. Extend right / left leg, keeping the other knee somewhat bent. Slightly point your toes inward on your back foot. Keep your right / left heel on the floor, bend your back knee, and slightly shift your weight over the back leg so that you feel a gentle stretch deep in your back calf. Hold this position for 10 seconds. Repeat 3 times. Complete this stretch 2 times per day.  STRETCH  Gastrocsoleus, Standing  Note: This exercise can place a lot of stress on your foot and ankle. Please complete this exercise only if specifically instructed by your caregiver.  Place the ball of your right / left foot on a step, keeping your other foot firmly on the same step. Hold on to the wall or a rail for balance. Slowly lift your other foot, allowing your body weight to press your heel down over the edge of the step. You should feel a stretch in your right / left calf. Hold this position for 10 seconds. Repeat this exercise with a slight bend in your right / left knee. Repeat 3 times. Complete this stretch 2 times per day.   STRENGTHENING EXERCISES - Plantar Fasciitis (Heel Spur Syndrome)  These exercises may help you when beginning to rehabilitate your injury. They may resolve your symptoms with or without further involvement from your physician, physical therapist or athletic trainer.  While completing these exercises, remember:  Muscles can gain both the endurance and the strength needed for everyday activities through controlled exercises. Complete these exercises as instructed by your physician, physical therapist or athletic trainer. Progress the resistance and repetitions only as guided.  STRENGTH - Towel Curls Sit in a chair positioned on a non-carpeted surface. Place your foot on a towel, keeping your heel on the floor. Pull the towel toward your heel by only curling your toes.  Keep your heel on the floor. Repeat 3 times. Complete this exercise 2 times per day.  STRENGTH - Ankle Inversion Secure one end of a rubber exercise band/tubing to a fixed object (table, pole). Loop the other end around your foot just before your toes. Place your fists between your knees. This will focus your strengthening at your ankle. Slowly, pull your big toe up and in, making sure the band/tubing is positioned to resist the entire motion. Hold this position for 10 seconds. Have your muscles resist the band/tubing as it slowly pulls your foot back to the starting position. Repeat 3 times. Complete this exercises 2 times per day.  Document Released: 08/10/2005 Document Revised: 11/02/2011 Document Reviewed: 11/22/2008 [facility] Patient Information 2014 [place], [place].

## 2022-08-03 NOTE — Progress Notes (Signed)
Subjective: Chief Complaint  Patient presents with   Foot Pain    Patient came in today for left heel pain, started 6 weeks ago, Rate of pain 7 out of 10, sharp pain, most pain at night after resting, X-Rays done today     71 year old female presents the office today with above concerns.  Started about 4-6 weeks ago. It started hurting just a little bitand though it was from the gym. She modified her activity it still hurt. It hurts more on the outer edge (pointing to latreal aspect) and going up the lateral ankle. No swelling, numbnes/tingling. No treatment.   Objective: AAO x3, NAD DP/PT pulses palpable bilaterally, CRT less than 3 seconds There is tenderness palpation on the plantar aspect left heel more on the lateral aspect.  This is going up and along the course of the peroneal tendon but no significant pain in that area today.  Flexor, extensor tendons appear to be intact.  MMT 5/5. No pain with calf compression, swelling, warmth, erythema  Assessment: Left heel pain, Planter fasciitis, peroneal tendinitis  Plan: -All treatment options discussed with the patient including all alternatives, risks, complications.  -Prescribed mobic. Discussed side effects of the medication and directed to stop if any are to occur and call the office.  -Plantar fascial brace dispensed -Stretching, icing today.  Discussion modifications and good arch support. -Patient encouraged to call the office with any questions, concerns, change in symptoms.   Trula Slade DPM

## 2022-09-03 DIAGNOSIS — H04123 Dry eye syndrome of bilateral lacrimal glands: Secondary | ICD-10-CM | POA: Diagnosis not present

## 2022-09-03 DIAGNOSIS — H16203 Unspecified keratoconjunctivitis, bilateral: Secondary | ICD-10-CM | POA: Diagnosis not present

## 2022-09-09 ENCOUNTER — Ambulatory Visit: Payer: Medicare Other | Admitting: Cardiology

## 2022-09-10 DIAGNOSIS — C4441 Basal cell carcinoma of skin of scalp and neck: Secondary | ICD-10-CM | POA: Diagnosis not present

## 2022-09-15 ENCOUNTER — Ambulatory Visit: Payer: Medicare Other | Admitting: Podiatry

## 2022-10-05 DIAGNOSIS — Z872 Personal history of diseases of the skin and subcutaneous tissue: Secondary | ICD-10-CM | POA: Diagnosis not present

## 2022-10-05 DIAGNOSIS — L814 Other melanin hyperpigmentation: Secondary | ICD-10-CM | POA: Diagnosis not present

## 2022-10-05 DIAGNOSIS — D225 Melanocytic nevi of trunk: Secondary | ICD-10-CM | POA: Diagnosis not present

## 2022-10-05 DIAGNOSIS — L821 Other seborrheic keratosis: Secondary | ICD-10-CM | POA: Diagnosis not present

## 2022-10-28 ENCOUNTER — Emergency Department (HOSPITAL_COMMUNITY)
Admission: EM | Admit: 2022-10-28 | Discharge: 2022-10-28 | Disposition: A | Discharge status: Home / Self Care | Payer: Medicare Other | Attending: Emergency Medicine | Admitting: Emergency Medicine

## 2022-10-28 ENCOUNTER — Emergency Department (HOSPITAL_COMMUNITY): Payer: Medicare Other

## 2022-10-28 ENCOUNTER — Other Ambulatory Visit: Payer: Self-pay

## 2022-10-28 DIAGNOSIS — R0789 Other chest pain: Secondary | ICD-10-CM | POA: Diagnosis not present

## 2022-10-28 DIAGNOSIS — I5189 Other ill-defined heart diseases: Secondary | ICD-10-CM | POA: Diagnosis not present

## 2022-10-28 DIAGNOSIS — E785 Hyperlipidemia, unspecified: Secondary | ICD-10-CM

## 2022-10-28 DIAGNOSIS — Z7982 Long term (current) use of aspirin: Secondary | ICD-10-CM | POA: Insufficient documentation

## 2022-10-28 DIAGNOSIS — I10 Essential (primary) hypertension: Secondary | ICD-10-CM | POA: Insufficient documentation

## 2022-10-28 DIAGNOSIS — R079 Chest pain, unspecified: Secondary | ICD-10-CM | POA: Diagnosis not present

## 2022-10-28 DIAGNOSIS — Z79899 Other long term (current) drug therapy: Secondary | ICD-10-CM | POA: Insufficient documentation

## 2022-10-28 LAB — BASIC METABOLIC PANEL
Anion gap: 9 (ref 5–15)
BUN: 19 mg/dL (ref 8–23)
CO2: 24 mmol/L (ref 22–32)
Calcium: 9.2 mg/dL (ref 8.9–10.3)
Chloride: 102 mmol/L (ref 98–111)
Creatinine, Ser: 0.87 mg/dL (ref 0.44–1.00)
GFR, Estimated: >60 mL/min (ref >60)
Glucose, Bld: 138 mg/dL — ABNORMAL HIGH (ref 70–99)
Potassium: 4.2 mmol/L (ref 3.5–5.1)
Sodium: 135 mmol/L (ref 135–145)

## 2022-10-28 LAB — CBC
HCT: 38.7 % (ref 36.0–46.0)
Hemoglobin: 13.1 g/dL (ref 12.0–15.0)
MCH: 31.6 pg (ref 26.0–34.0)
MCHC: 33.9 g/dL (ref 30.0–36.0)
MCV: 93.5 fL (ref 80.0–100.0)
Platelets: 213 10*3/uL (ref 150–400)
RBC: 4.14 MIL/uL (ref 3.87–5.11)
RDW: 13.6 % (ref 11.5–15.5)
WBC: 4.7 10*3/uL (ref 4.0–10.5)
nRBC: 0 % (ref 0.0–0.2)

## 2022-10-28 LAB — TROPONIN I (HIGH SENSITIVITY)
Troponin I (High Sensitivity): 3 ng/L (ref <18)
Troponin I (High Sensitivity): 3 ng/L (ref <18)

## 2022-10-28 MED ORDER — AMLODIPINE BESYLATE 5 MG PO TABS: 5.0000 mg | ORAL_TABLET | Freq: Every day | ORAL | 0 refills | Status: DC

## 2022-10-28 NOTE — Discharge Instructions (Addendum)
You were seen in the emergency department today for chest pain.  While you were here your EKG, chest x-ray and cardiac enzymes were normal.  We had you evaluated by the cardiologist who recommended an additional blood pressure medication called amlodipine for you to start.  Additionally we will have you follow back up with Dr. Radford Pax in the outpatient setting.  Please return to the emergency department if you have worsening chest pain symptoms.

## 2022-10-28 NOTE — Consult Note (Addendum)
Cardiology Consultation   Patient ID: Patricia [name] MRN: [id number]; DOB: [birth year]  Admit date: 10/28/2022 Date of Consult: 10/28/2022  PCP:  Patricia [name], [facility] Cardiologist:  Patricia Him, MD       Patient Profile:   Patricia [name] is a 72 y.o. female with a hx of coronary artery calcifications by chest CT, HTN, HLD, mitral valve regurgitation, PFO on echo who is being seen 10/28/2022 for the evaluation of chest pain at the request of Dr. Armandina [name].  History of Present Illness:   Patricia [name] is a 72 year old female with above medical history who is followed by Patricia [name]. 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  In 01/2019, patient had indigestion and chest pressure in her emergency sternal area.  She took a baby aspirin, called EMS.  EMS did EKG which showed an incomplete right bundle branch block.  Her blood pressure was elevated.  EMS left it up to patient on whether she wanted to go to the hospital and she decided to stay home.  She was seen as an outpatient in 02/2019.  Had an echocardiogram on 03/09/2019 that showed EF 123456, left diastolic parameters consistent with pseudonormalization, normal RV systolic function.  The mitral valve, tricuspid valve, and aortic valves were all grossly normal.  For further evaluation, patient had a CT cardiac scoring on 03/29/2019 that showed a coronary calcium score of 182 (83rd percentile).  Patient was treated with aspirin, statin.  Patient was last seen by cardiology on 07/09/21.  At that time, patient was doing well and was without chest pain, shortness of breath, dyspnea on exertion, orthopnea, dizziness, palpitations.  She was seen by the lipid clinic Pharm.D. on 11/05/2021, at which time patient was treated with Lipitor 40 mg daily.  She had stopped her  aspirin due to frequent bruising, and she was agreeable to taking aspirin every other day.  Patient presented to the ED on 3/6 complaining of chest pain that had been intermittent for a few days. hsTn 3. Na 135, K 4.2, creatinine 0.87, WBC 4.7, hemoglobin 13.1, platelets 213. CXR showed no active cardiopulmonary disease. EKG showed sinus rhythm, heart rate 59 bpm, incomplete RBBB.   On interview, patient denies having any chest pain.  Reports that since Saturday 3/2, she has had 1 episode per day of lightheadedness followed by tightness in her right shoulder.  Patient reports very mild lightheadedness, does not feel dizzy or like she is going to pass out.  She does not have headache, vision changes, weakness, nausea.  These episodes occur in the morning and last about 30 seconds - 1 minute.  Symptoms resolve on their own.  These episodes exclusively occur when she is at rest.  She is very active for her age and exercises 5 days a week at her gym.  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  She denies any chest pain on exertion.  Denies excessive shortness of breath on exertion.  Denies palpitations, changes in appetite, nausea, vomiting, nasal congestion, sore throat.  No cough.  Today, when she had this episode of lightheadedness, she again developed tightness in her right shoulder.  This tightness radiated around to her back and into her left shoulder.  She did not have any radiation of pain across her  chest.  Patient reports that she has not been taking aspirin due to concerns of easy bruising.  She is compliant with her cholesterol medication.  She does not check her blood pressure regularly at home.  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 Authorizing Provider  acetaminophen (TYLENOL) 325 MG tablet Take 650 mg by mouth every 6 (six) hours as needed for mild pain or headache.    [provider]  alendronate (FOSAMAX) 70 MG tablet Take 70 mg by mouth once a week. Take with a full glass of water on an empty stomach.    [provider]  Ascorbic Acid (VITAMIN C) 1000 MG tablet Take 500 mg by mouth daily.    [provider]  aspirin EC 81 MG tablet Take 81 mg by mouth every other day. Bruising on daily dosing    [provider]  atorvastatin (LIPITOR) 20 MG tablet Take 1 tablet (20 mg total) by mouth daily. 12/10/21   Sueanne Margarita, MD  Biotin 1000 MCG CHEW Take 1 tablet by mouth daily.    [provider]  CALCIUM CARBONATE-VITAMIN D PO Take 1 tablet by mouth daily.    [provider]  carboxymethylcellulose (REFRESH PLUS) 0.5 % SOLN Place 1 drop into both eyes daily as needed (dry eyes).    [provider]  Cholecalciferol 50 MCG (2000 UT) TABS Take 1 tablet by mouth daily.    [provider]  fexofenadine (ALLEGRA) 180 MG tablet Take 180 mg by mouth as needed for allergies or rhinitis.    [provider]  fluticasone (FLONASE) 50 MCG/ACT nasal spray Place into both nostrils as needed for allergies or rhinitis.    [provider]  losartan (COZAAR) 25 MG tablet TAKE 1 TABLET(25 MG) BY MOUTH DAILY 03/09/22   Sueanne Margarita, MD  meloxicam (MOBIC) 7.5 MG tablet Take 1 tablet (7.5 mg total) by mouth daily. 08/03/22   Trula Slade, DPM  Multiple Vitamin (MULTIVITAMIN) tablet Take 1 tablet by mouth daily.    [provider]  Omega 3-6-9 Fatty Acids (OMEGA 3-6-9 COMPLEX PO) Take 1 tablet by mouth daily.    [provider]   sodium chloride (OCEAN) 0.65 % SOLN nasal spray Place 1 spray into both nostrils as needed for congestion.    [provider]  vitamin B-12 (CYANOCOBALAMIN) 500 MCG tablet Chew 1 tablet by mouth daily.    [provider]    Inpatient Medications: Scheduled Meds:  Continuous Infusions:  PRN Meds:   Allergies:    Allergies  Allergen Reactions   Doxycycline Nausea And Vomiting    Became Dehydrated   Atorvastatin     Constipation, less energy sleep and muscle strength, worse memory, bloating on '40mg'$  dose. Ok on '20mg'$      Ezetimibe     Cramping in calves   Rosuvastatin     Cramping in feet    Social History:  Social History   Socioeconomic History   Marital status: Married    Spouse name: Not on file   Number of children: Not on file   Years of education: Not on file   Highest education level: Not on file  Occupational History   Not on file  Tobacco Use   Smoking status: Never   Smokeless tobacco: Never  Substance and Sexual Activity   Alcohol use: Yes    Alcohol/week: 2.0 - 3.0 standard drinks of alcohol    Types: 2 - 3 Glasses of wine per week   Drug use: No   Sexual activity: Not on file  Other Topics Concern   Not on file  Social History Narrative   Not on file   Social Determinants of Health   Financial Resource Strain: Not on file  Food Insecurity: Not on file  Transportation Needs: Not on file  Physical Activity: Not on file  Stress: Not on file  Social Connections: Not on file  Intimate Partner Violence: Not on file    Family History:    Family History  Problem Relation Age of Onset   COPD Mother    Heart attack Father      ROS:  Please see the history of present illness.   All other ROS reviewed and negative.     Physical Exam/Data:   Vitals:   10/28/22 1015 10/28/22 1021 10/28/22 1030 10/28/22 1130  BP: (!) 168/79  (!) 149/73 (!) 168/81  Pulse: 62 60 (!) 59 (!) 56  Resp:  '17 10 15  '$ Temp: 97.6 F (36.4 C)      TempSrc: Oral     SpO2: 100% 100% 100% 100%  Weight: 59 kg     Height: '5\' 7"'$  (1.702 m)      No intake or output data in the 24 hours ending 10/28/22 1315    10/28/2022   10:15 AM 07/09/2021    2:18 PM 06/27/2020    2:02 PM  Last 3 Weights  Weight (lbs) 130 lb 128 lb 12.8 oz 133 lb 3.2 oz  Weight (kg) 58.968 kg 58.423 kg 60.419 kg     Body mass index is 20.36 kg/m.  General:  Well nourished, well developed, in no acute distress. Sitting upright in the bed HEENT: normal Neck: no JVD Vascular: Radial pulses 2+ bilaterally Cardiac:  normal S1, S2; RRR; no murmur Lungs:  clear to auscultation bilaterally, no wheezing, rhonchi or rales. Normal WOB on room air  Abd: soft, nontender Ext: no edema in BLE Musculoskeletal:  No deformities, BUE and BLE strength normal and equal. No tenderness to palpation over thoracic spine, cervical spine, or lumbar spine. No tenderness to palpation over L/R shoulders or upper back muscles  Skin: warm and dry  Neuro:  CNs 2-12 intact, no focal abnormalities noted Psych:  Normal affect   EKG:  The EKG was personally reviewed and demonstrates:  Normal sinus rhythm, incomplete RBBB, HR 59 BPM  Telemetry:  Telemetry was personally reviewed and demonstrates:  Normal sinus rhythm, HR in the 50s-60s   Relevant CV Studies:   Laboratory Data:  High Sensitivity Troponin:   Recent Labs  Lab 10/28/22 1019  TROPONINIHS 3     Chemistry Recent Labs  Lab 10/28/22 1019  NA 135  K 4.2  CL 102  CO2 24  GLUCOSE 138*  BUN 19  CREATININE 0.87  CALCIUM 9.2  GFRNONAA >60  ANIONGAP 9    No results for input(s): "PROT", "ALBUMIN", "AST", "ALT", "  ALKPHOS", "BILITOT" in the last 168 hours. Lipids No results for input(s): "CHOL", "TRIG", "HDL", "LABVLDL", "LDLCALC", "CHOLHDL" in the last 168 hours.  Hematology Recent Labs  Lab 10/28/22 1019  WBC 4.7  RBC 4.14  HGB 13.1  HCT 38.7  MCV 93.5  MCH 31.6  MCHC 33.9  RDW 13.6  PLT 213   Thyroid No results  for input(s): "TSH", "FREET4" in the last 168 hours.  BNPNo results for input(s): "BNP", "PROBNP" in the last 168 hours.  DDimer No results for input(s): "DDIMER" in the last 168 hours.   Radiology/Studies:  DG Chest 2 View  Result Date: 10/28/2022 CLINICAL DATA:  Chest pain EXAM: CHEST - 2 VIEW COMPARISON:  07/07/2018 FINDINGS: The heart size and mediastinal contours are within normal limits. Both lungs are clear. The visualized skeletal structures are unremarkable. IMPRESSION: No active cardiopulmonary disease. Electronically Signed   By: Fidela Salisbury M.D.   On: 10/28/2022 11:03     Assessment and Plan:   Lightheadedness  Right Shoulder Tightness - Patient presents today complaining of episodes of lightheadedness, right shoulder tightness.  These episodes have occurred once per day since Saturday 3/2.  Episodes occur while at rest, resolve after 30 seconds - 1 minute.  Patient reports that the lightheadedness is rather mild, and denies dizziness, syncope, near syncope, vision changes, muscle weakness, palpitations, nausea.  Shoulder tightness occasionally radiates to the back.  Today, shoulder tightness radiated around to her back and into her left shoulder.  She did not have any chest pain.  Shoulder pain does not radiate to chest - Patient is very active and exercises at her gym 5 days a week doing a mix of cardio and weightlifting. Denies having any chest pain on exertion  Denies shortness of breath on exertion - Initial troponin 3.  Chest x-ray without acute cardiopulmonary abnormality - The symptoms are very atypical for cardiac source.  Patient is followed by Patricia [name], has not been seen by cardiology since 06/2021.  I will arrange a close follow-up appointment with an APP at one of our offices.  Can consider coronary CTA at that time given her history of elevated coronary calcium score  - MD to see  Elevated Coronary Calcium score  - Patient did have a coronary calcium score of 182  in 2020 (83 percentile) - Patient does not take aspirin as she was had developed easy bruising/bleeding - Continue Lipitor - Not on beta-blocker given heart rate in the 50s-60s  Hypertension - Patient does not measure her blood pressure at home regularly.  Has been on losartan 25 mg daily - BP has been elevated in the ED today.  She did take her blood pressure medications this morning - Start amlodipine 5 mg daily and encouraged patient to keep a blood pressure log prior to her follow-up appointment.   Risk Assessment/Risk Scores:    For questions or updates, please contact [place] Please consult [email or web address] for contact info under    Signed, Margie Billet, PA-C  10/28/2022 1:15 PM   Patient seen and examined. Agree with assessment and plan.  Mr. Raelie Higley is a very pleasant 72 year old female who is followed by Patricia [name].  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  In light 2020 an echo Doppler study confirmed normal LV function with EF 60 to 65%.  Diastolic parameters were consistent  with pseudonormalization a grade 2 diastolic dysfunction.  She underwent CT calcium scoring in August 2020 which showed a calcium score of 182 representing 83rd percentile.  Subsequently she has been on aspirin and statin therapy.  She has remained very active.  She exercises regularly.  Denies any exertional chest pain or shortness of breath.  She recently has noted some short-lived episodes of right shoulder discomfort which would last 1 to 2 minutes.  He went to the gym yesterday.  Her gym workout is significant where she does elliptical, lifts weights, rowing, and riding the bicycle without discomfort.  Today while driving she experienced an episode of mild lightheadedness with tightness in her right shoulder which seem to radiate in the upper  back to her left shoulder.  It resolved spontaneously. She presented to the ER for evaluation.  Arrival, blood pressure was elevated at 168/79, Troponin is negative at 3 and 3.  She has been on losartan 25 mg daily and atorvastatin 20 mg.  In March 2023 total cholesterol was 179 with LDL at 83.  Presently, she is asymptomatic.  She appears younger than stated age.  There is no JVD.  There is no chest wall discomfort.  She does have some muscle tension in the trapezius with knot.  There is regular with no ectopy.  There is trivial 1/6 systolic murmur.  Abdomen is nontender.  There is no clubbing cyanosis or edema.  ECG shows sinus rhythm at 59 with incomplete right bundle branch block.  Laboratory is normal.  The patient was discomfort does not sound cardiac in etiology and most likely may represent musculoskeletal etiology.  Blood pressure is elevated.  She is mildly bradycardic.  Would recommend the addition of amlodipine 5 mg for more optimal blood pressure control and this will also have some potential anti-ischemic benefit.  Okay to discharge patient home today with office follow-up.  In the future may be worthwhile to consider doing coronary CTA for reassessment of calcium score and to make certain she does not have any significant coronary luminal obstruction.   Troy Sine, MD, [facility] 10/28/2022 3:16 PM

## 2022-10-28 NOTE — ED Triage Notes (Addendum)
Pt BIB EMS due to chest pain. Pt states it started Sunday. It is not constant. Pt c/o tightness in back and left shoulder. Pt is axox4. Hx of HTN and mitral valve prolaspe. Pt states CP lasted the longest today. Pt received 324 of aspirin and 1 nitro

## 2022-10-28 NOTE — ED Provider Notes (Signed)
[facility] Provider Note   CSN: [id number] Arrival date & time: 10/28/22  1013     History  Chief Complaint  Patient presents with   Chest Pain    Patricia [name] is a 72 y.o. female. With past medical history of mitral valve prolapse, hypertension, hyperlipidemia who presents to the emergency department with chest pain.  States that she began having chest pain 5 days ago.  She states that it has been intermittent.  She describes it as right-sided chest pain that is tight and radiates to her right shoulder.  She states that the symptoms last about 1 to 2 minutes.  She states that prior to the onset of her chest pain she has this "weird sensation like and can be sick," but has difficulty elaborating on this.  She states that then today she was driving to church today when she started having the same symptoms however the chest pain radiated around her back to her left shoulder.  She denies having other symptoms such as shortness of breath, palpitations, diaphoresis, nausea, lightheadedness or dizziness or syncope.  The chest pain is not exertional.  She states that she has been working out per normal at the gym.  Chest pain never arises when she is exercising.   Chest Pain Associated symptoms: no palpitations and no shortness of breath        Home Medications Prior to Admission medications   Medication Sig Start Date End Date Taking? Authorizing Provider  amLODipine (NORVASC) 5 MG tablet Take 1 tablet (5 mg total) by mouth daily. 10/28/22  Yes Mickie Hillier, PA-C  acetaminophen (TYLENOL) 325 MG tablet Take 650 mg by mouth every 6 (six) hours as needed for mild pain or headache.    [provider]  alendronate (FOSAMAX) 70 MG tablet Take 70 mg by mouth once a week. Take with a full glass of water on an empty stomach.    [provider]  Ascorbic Acid (VITAMIN C) 1000 MG tablet Take 500 mg by mouth daily.    [provider]  aspirin EC 81 MG tablet Take 81 mg by mouth every other day. Bruising on daily dosing    [provider]  atorvastatin (LIPITOR) 20 MG tablet Take 1 tablet (20 mg total) by mouth daily. 12/10/21   Sueanne Margarita, MD  Biotin 1000 MCG CHEW Take 1 tablet by mouth daily.    [provider]  CALCIUM CARBONATE-VITAMIN D PO Take 1 tablet by mouth daily.    [provider]  carboxymethylcellulose (REFRESH PLUS) 0.5 % SOLN Place 1 drop into both eyes daily as needed (dry eyes).    [provider]  Cholecalciferol 50 MCG (2000 UT) TABS Take 1 tablet by mouth daily.    [provider]  fexofenadine (ALLEGRA) 180 MG tablet Take 180 mg by mouth as needed for allergies or rhinitis.    [provider]  fluticasone (FLONASE) 50 MCG/ACT nasal spray Place into both nostrils as needed for allergies or rhinitis.    [provider]  losartan (COZAAR) 25 MG tablet TAKE 1 TABLET(25 MG) BY MOUTH DAILY 03/09/22   Sueanne Margarita, MD  meloxicam (MOBIC) 7.5 MG tablet Take 1 tablet (7.5 mg total) by mouth daily. 08/03/22   Trula Slade, DPM  Multiple Vitamin (MULTIVITAMIN) tablet Take 1 tablet by mouth daily.    [provider]  Omega 3-6-9 Fatty Acids (OMEGA 3-6-9 COMPLEX PO) Take 1  tablet by mouth daily.    [provider]  sodium chloride (OCEAN) 0.65 % SOLN nasal spray Place 1 spray into both nostrils as needed for congestion.    [provider]  vitamin B-12 (CYANOCOBALAMIN) 500 MCG tablet Chew 1 tablet by mouth daily.    [provider]      Allergies    Doxycycline, Atorvastatin, Ezetimibe, and Rosuvastatin    Review of Systems   Review of Systems  Respiratory:  Negative for shortness of breath.   Cardiovascular:  Positive for chest pain. Negative for palpitations.  All other systems reviewed and are negative.   Physical Exam Updated Vital Signs BP (!) 145/89   Pulse 61   Temp 97.6 F  (36.4 C) (Oral)   Resp 18   Ht '5\' 7"'$  (1.702 m)   Wt 59 kg   SpO2 100%   BMI 20.36 kg/m  Physical Exam Vitals and nursing note reviewed.  Constitutional:      General: She is not in acute distress.    Appearance: Normal appearance. She is well-developed. She is not ill-appearing or toxic-appearing.  HENT:     Head: Normocephalic.  Eyes:     General: No scleral icterus. Neck:     Vascular: No JVD.  Cardiovascular:     Rate and Rhythm: Normal rate and regular rhythm.     Pulses:          Radial pulses are 2+ on the right side and 2+ on the left side.     Heart sounds: Normal heart sounds. No murmur heard. Pulmonary:     Effort: Pulmonary effort is normal. No tachypnea.     Breath sounds: Normal breath sounds.  Chest:     Chest wall: No tenderness.  Abdominal:     General: Bowel sounds are normal.     Palpations: Abdomen is soft.  Musculoskeletal:     Cervical back: Neck supple.     Right lower leg: No edema.     Left lower leg: No edema.  Skin:    General: Skin is warm and dry.     Capillary Refill: Capillary refill takes less than 2 seconds.  Neurological:     General: No focal deficit present.     Mental Status: She is alert and oriented to person, place, and time. Mental status is at baseline.  Psychiatric:        Mood and Affect: Mood normal.        Behavior: Behavior normal.        Thought Content: Thought content normal.        Judgment: Judgment normal.     ED Results / Procedures / Treatments   Labs (all labs ordered are listed, but only abnormal results are displayed) Labs Reviewed  BASIC METABOLIC PANEL - Abnormal; Notable for the following components:      Result Value   Glucose, Bld 138 (*)    All other components within normal limits  CBC  TROPONIN I (HIGH SENSITIVITY)  TROPONIN I (HIGH SENSITIVITY)    EKG EKG Interpretation  Date/Time:  Wednesday October 28 2022 10:20:40 EST Ventricular Rate:  59 PR Interval:  176 QRS Duration: 102 QT  Interval:  415 QTC Calculation: 412 R Axis:   117 Text Interpretation: Sinus rhythm Consider right ventricular hypertrophy Confirmed by Regan Lemming ([id number]) on 10/28/2022 10:23:28 AM  Radiology DG Chest 2 View  Result Date: 10/28/2022 CLINICAL DATA:  Chest pain EXAM: CHEST - 2 VIEW COMPARISON:  07/07/2018 FINDINGS: The heart size and mediastinal contours are within normal limits. Both lungs are clear. The visualized skeletal structures are unremarkable. IMPRESSION: No active cardiopulmonary disease. Electronically Signed   By: Fidela Salisbury M.D.   On: 10/28/2022 11:03    Procedures Procedures   Medications Ordered in ED Medications - No data to display  ED Course/ Medical Decision Making/ A&P            HEART Score: 4                Medical Decision Making Amount and/or Complexity of Data Reviewed Labs: ordered. Radiology: ordered.  Risk Prescription drug management.  Initial Impression and Ddx 72 year old female who presents to the emergency department with chest pain. Patient PMH that increases complexity of ED encounter: Hypertension, mitral regurgitation, hyperlipidemia Differential: Acute chest syndrome, stable angina, atypical angina, pulmonary embolism, pneumothorax, aortic dissection, pleural effusion, CHF, COPD, asthma, myocarditis, pericarditis, cardiac tamponade, chest wall pain   Interpretation of Diagnostics I independent reviewed and interpreted the labs as followed: wbc wnl, bmp wnl, troponin x2 negative  - I independently visualized the following imaging with scope of interpretation limited to determining acute life threatening conditions related to emergency care: CXR, which revealed no acute findings   Patient Reassessment and Ultimate Disposition/Management 72 year old female who presents to the emergency department complaints of chest pain.  She is overall well-appearing, nonseptic and nontoxic in appearance on initial exam.  She is mildly hypertensive. Her  chest pain symptoms sound atypical as they are not exertional, progressive, however proceeding with ACS workup.  She does have a history of hypertension hyperlipidemia and is followed by Dr. Radford Pax with [facility].  EKG without ischemia or infarction, troponin x2 negative, doubt ACS  Does not appear fluid volume overloaded on exam, no edema on CXR, doubt new onset CHF  Considered but doubt PE. PERC 1 given age, Rock Nephew low risk so will defer d-dimer or CTA PE study at this time. CXR without evidence of pneumonia, pleural effusion or pneumothorax. No recent illnesses and troponin negative so doubt pericarditis or myocarditis  Symptoms inconsistent with aortic dissection  HEART Score: 4    Consulted with cardiology given her higher heart score, medical history and that she is already followed by [facility].  Dr. Claiborne Billings, cardiology attending came down to evaluate the patient at bedside.  He is well feels that this is atypical chest pain.  He does not feel there is need to be admission or further workup at this time.  He is requesting that the patient be started on amlodipine 5 mg daily.  He is also requesting a closer follow-up with cardiology in outpatient setting for possible CTA coronary.  I have placed order for amlodipine.  She is instructed to call cardiology tomorrow to set up a follow-up appointment.  Otherwise this time she is stable after being evaluated.  Do not feel that she needs further workup at this time.  Her blood pressure is come down to 140s without intervention.  She is given return precautions for worsening symptoms.  The patient has been appropriately medically screened and/or stabilized in the ED. I have low suspicion for any other emergent medical condition which would require further screening, evaluation or treatment in the ED or require inpatient management. At time of discharge the patient is hemodynamically stable and in no acute distress. I have discussed work-up results and diagnosis with  patient and answered all questions. Patient is agreeable with discharge plan. We discussed strict return  precautions for returning to the emergency department and they verbalized understanding.   Patient management required discussion with the following services or consulting groups:  Cardiology  Complexity of Problems Addressed Acute complicated illness or Injury  Additional Data Reviewed and Analyzed Further history obtained from: Further history from spouse/family member, Past medical history and medications listed in the EMR, Recent PCP notes, and Care Everywhere  Patient Encounter Risk Assessment Prescriptions and Consideration of hospitalization  Final Clinical Impression(s) / ED Diagnoses Final diagnoses:  Atypical chest pain    Rx / DC Orders ED Discharge Orders          Ordered    amLODipine (NORVASC) 5 MG tablet  Daily        10/28/22 1453              Mickie Hillier, PA-C 10/29/22 [id number]    Regan Lemming, MD 11/02/22 0745

## 2022-11-03 DIAGNOSIS — K222 Esophageal obstruction: Secondary | ICD-10-CM | POA: Diagnosis not present

## 2022-11-03 DIAGNOSIS — Z0000 Encounter for general adult medical examination without abnormal findings: Secondary | ICD-10-CM | POA: Diagnosis not present

## 2022-11-03 DIAGNOSIS — I10 Essential (primary) hypertension: Secondary | ICD-10-CM | POA: Diagnosis not present

## 2022-11-03 DIAGNOSIS — I2510 Atherosclerotic heart disease of native coronary artery without angina pectoris: Secondary | ICD-10-CM | POA: Diagnosis not present

## 2022-11-03 NOTE — Progress Notes (Unsigned)
Cardiology Office Note:    Date:  11/05/2022   ID:  Patricia [name], DOB 02-05-1951, MRN [id number]  PCP:  Seward Carol, MD   [facility] Providers Cardiologist:  Fransico Him, MD     Referring MD: Seward Carol, MD   Chief Complaint: increased shortness of breath and chest pain  History of Present Illness:    Patricia [name] is a very pleasant 72 y.o. female with a hx of pure hypercholesteremia, coronary artery calcifications on CT with calcium score 182, placing her in the 84th percentile for age/sex, HTN, mild MR, family history of CAD, statin intolerance, and PFO.   Prior history of mitral valve prolapse with no MVP noted on echo 2011, PFO by echo. She has maintained consistent follow-up for several years. Has been seen by both Dr. Agustin Cree and Dr. Radford Pax. She is a retired Patent attorney.   She underwent CT calcium score 03/29/2019 with calcium score 182 (83rd percentile for age/sex matched controls). TTE 03/10/19 revealed normal LVEF 60-65%, pseudonormalization of LV diastolic parameters, normal RV, normal MV, aneurysmal interatrial septum.  Seen in cardiology clinic by Dr. Radford Pax on 07/09/21 at which time she reported cramping in her feet for the previous 3 months.  She had been changed from atorvastatin to rosuvastatin by PCP for memory issues.  She was referred to lipid clinic and 1 year follow-up was recommended.  Seen by Fuller Canada, RPH  on 11/05/21 for management of lipids. She reported cramping in her feet on rosuvastatin 06/2021 and was changed back to atorvastatin. Follow-up LDL was 83 and she was started on ezetimibe. She discontinued atorvastatin secondary to leg cramping.  She has tolerated atorvastatin 20 mg daily without s/e. Cramping resolved almost immediately when she stopped ezetimibe.  Discussion of nonstatin therapies ensued, however patient chose to increase atorvastatin to 40 mg daily. On 12/09/2021 she reported s/e on higher dose of constipation, trouble  sleeping, decrease in energy and muscle strength, abdominal weight and bloating, decrease in mental concentration and memory. She did not wish to try a different agent at that time. She continued atorvastatin 10 mg daily.   Today, she is here for evaluation of upper chest/shoulder discomfort that initially started a few days prior to ED visit on 10/28/2022. Symptoms described as tightness in the right should, associated with lightheadedness.  Symptoms generally last 30 seconds to 1 minute  and are not accompanied by n/v, weakness, diaphoresis. Thought she had injured herself at the gym, however symptoms continued to occur over the next few days. On 10/28/2022 she was walking into [employer] study and noted pain that started in right shoulder and radiated around her back to left shoulder, no radiation of pain into chest. She walked into the church and was immediately assessed by NP and RN. EMS was called and she was transported to ED. Symptoms resolved prior to EMS arrival. Was given sl ntg with no change in symptoms.  EKG was reviewed by Dr. Claiborne Billings, cardiology consult and revealed normal sinus rhythm, incomplete RBBB, HR 59 bpm. Telemetry revealed NSR. Hs troponin negative x 2.  CXR without acute cardiopulmonary abnormality.  Symptoms atypical for angina.  Recommendation to schedule outpatient follow-up. Amlodipine 5 mg daily was added for elevated BP. Since ED visit, has been to the gym 5 times. Notes that recovery is taking longer between exercises. She has not had any chest discomfort or palpitations.  No tightness in her shoulder.  Has noted increased shortness of breath and increase in girth from waist to  thighs without any change in diet, fluid intake, or exercise regimen. About two months prior to this event, noted she was short of breath when getting up from floor exercise or playing with grandchildren, typically lasts 10-15 sec. Has recorded home BP readings since 3/6 - SBP 114-128, DBP 72-82 mmHg.   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   alendronate (FOSAMAX) 70 MG tablet Take 70 mg by mouth once a week. Take with a full glass of water on an empty stomach.   amLODipine (NORVASC) 5 MG tablet Take 1 tablet (5 mg total) by mouth daily.   Ascorbic Acid (VITAMIN C) 1000 MG tablet Take 500 mg by mouth daily.   aspirin EC 81 MG tablet Take 81 mg by mouth every other day. Bruising on daily dosing   atorvastatin (LIPITOR) 20 MG tablet Take 1 tablet (20 mg total) by mouth daily.   Biotin 1000 MCG CHEW Take 1 tablet by mouth daily.   CALCIUM CARBONATE-VITAMIN D PO Take 1 tablet by mouth daily.   carboxymethylcellulose (REFRESH PLUS) 0.5 % SOLN Place 1 drop into both eyes daily as needed (dry eyes).   Cholecalciferol 50 MCG (2000 UT) TABS Take 1 tablet by mouth daily.   fexofenadine (ALLEGRA) 180 MG tablet Take 180 mg by mouth as needed for allergies or rhinitis.   fluticasone (FLONASE) 50 MCG/ACT nasal spray Place into both nostrils as needed for allergies or rhinitis.   losartan (COZAAR) 25 MG tablet TAKE 1 TABLET(25 MG) BY MOUTH DAILY   meloxicam (MOBIC) 7.5 MG tablet Take 1 tablet (7.5 mg total) by mouth daily.   Menaquinone-7 (VITAMIN K2 PO) Take by mouth. Per patient taking 30 mcg daily   metoprolol tartrate (LOPRESSOR) 50 MG tablet Take one (1) tablet by mouth ( 50 mg) 2 hours prior to CT scan.   Multiple Vitamin (MULTIVITAMIN) tablet Take 1 tablet by mouth daily.   Omega 3-6-9 Fatty Acids (OMEGA 3-6-9  COMPLEX PO) Take 1 tablet by mouth daily.   sodium chloride (OCEAN) 0.65 % SOLN nasal spray Place 1 spray into both nostrils as needed for congestion.   vitamin B-12 (CYANOCOBALAMIN) 500 MCG tablet Chew 1 tablet by mouth daily.     Allergies:   Doxycycline, Atorvastatin, Ezetimibe, and Rosuvastatin   Social History   Socioeconomic History   Marital status: Married    Spouse name: Not on file   Number of children: Not on file   Years of education: Not on file   Highest education level: Not on file  Occupational History   Not on file  Tobacco Use   Smoking status: Never   Smokeless tobacco: Never  Substance and Sexual Activity   Alcohol use: Yes    Alcohol/week: 2.0 - 3.0 standard drinks of alcohol    Types: 2 - 3 Glasses of wine per week   Drug use: No  Sexual activity: Not on file  Other Topics Concern   Not on file  Social History Narrative   Not on file   Social Determinants of Health   Financial Resource Strain: Not on file  Food Insecurity: Not on file  Transportation Needs: Not on file  Physical Activity: Not on file  Stress: Not on file  Social Connections: Not on file     Family History: The patient's family history includes COPD in her mother; Heart attack in her father.  ROS:   Please see the history of present illness.   + chest pain + shortness of breath  All other systems reviewed and are negative.  Labs/Other Studies Reviewed:    The following studies were reviewed today:  Cardiac Studies & Procedures     STRESS TESTS  NM MYOCAR MULTI W/SPECT W 10/26/2013   ECHOCARDIOGRAM  ECHOCARDIOGRAM COMPLETE 03/10/2019   IMPRESSIONS   1. The left ventricle has normal systolic function with an ejection fraction of 60-65%. The cavity size was normal. Left ventricular diastolic Doppler parameters are consistent with pseudonormalization. 2. The right ventricle has normal systolic function. The cavity was normal. There is no increase in right ventricular  wall thickness. 3. The mitral valve is grossly normal. 4. The tricuspid valve is grossly normal. 5. The aortic valve is grossly normal. Aortic valve regurgitation was not assessed by color flow Doppler. 6. The aortic root is normal in size and structure. 7. The interatrial septum is aneurysmal.  FINDINGS Left Ventricle: The left ventricle has normal systolic function, with an ejection fraction of 60-65%. The cavity size was normal. There is no increase in left ventricular wall thickness. Left ventricular diastolic Doppler parameters are consistent with pseudonormalization.  Right Ventricle: The right ventricle has normal systolic function. The cavity was normal. There is no increase in right ventricular wall thickness.  Left Atrium: Left atrial size was normal in size.  Right Atrium: Right atrial size was normal in size. Right atrial pressure is estimated at 3 mmHg.  Interatrial Septum: No atrial level shunt detected by color flow Doppler. An The interatrial septum is aneurysmal is seen.  Pericardium: There is no evidence of pericardial effusion.  Mitral Valve: The mitral valve is grossly normal. Mitral valve regurgitation was not assessed by color flow Doppler.  Tricuspid Valve: The tricuspid valve is grossly normal. Tricuspid valve regurgitation is mild by color flow Doppler.  Aortic Valve: The aortic valve is grossly normal Aortic valve regurgitation was not assessed by color flow Doppler.  Pulmonic Valve: The pulmonic valve was not well visualized. Pulmonic valve regurgitation was not assessed by color flow Doppler.  Aorta: The aortic root is normal in size and structure.  Venous: The inferior vena cava measures 0.83 cm, is normal in size with greater than 50% respiratory variability.      CT SCANS  CT CARDIAC SCORING (SELF PAY ONLY) 03/29/2019  Addendum 03/29/2019  4:36 PM ADDENDUM REPORT: 03/29/2019 16:33  CLINICAL DATA:  Risk stratification  EXAM: Coronary Calcium  Score  TECHNIQUE: The patient was scanned on a Siemens Somatom 64 slice scanner. Axial non-contrast 3 mm slices were carried out through the heart. The data set was analyzed on a dedicated work station and scored using the [place].  FINDINGS: Non-cardiac: See separate report from [facility].  Ascending aorta: Normal diameter 3.5 cm  Pericardium: Normal  Coronary arteries: Calcium noted in proximal/mid RCA and LAD  IMPRESSION: Coronary calcium score of 182. This was 61 rd percentile for age  and sex matched control.   Narrative EXAM: OVER-READ INTERPRETATION  CT CHEST  The following report is an over-read performed by radiologist Dr. Lorin Picket of [facility], [place] on 03/29/2019. This over-read does not include interpretation of cardiac or coronary anatomy or pathology. The coronary calcium score/coronary CTA interpretation by the cardiologist is attached.  COMPARISON:  None.  FINDINGS: Vascular: Atherosclerotic calcification of the aorta. No pericardial effusion.  Mediastinum/Nodes: None.  Lungs/Pleura: 3 mm left lower lobe nodule (series 3, image 37). No pleural fluid.  Upper Abdomen: None.  Musculoskeletal: None.  IMPRESSION: 1. 3 mm left lower lobe nodule. No follow-up needed if patient is low-risk. Non-contrast chest CT can be considered in 12 months if patient is high-risk. This recommendation follows the consensus statement: Guidelines for Management of Incidental Pulmonary Nodules Detected on CT Images: From the Fleischner Society 2017; Radiology 2017; 284:228-243. 2.  Aortic atherosclerosis (ICD10-170.0).  Electronically Signed: By: Lorin Picket M.D. On: 03/29/2019 16:22           Recent Labs: 10/28/2022: BUN 19; Creatinine, Ser 0.87; Hemoglobin 13.1; Platelets 213; Potassium 4.2; Sodium 135  Recent Lipid Panel    Component Value Date/Time   CHOL 179 10/28/2021 0722   TRIG 66 10/28/2021 0722   HDL 84 10/28/2021  0722   CHOLHDL 2.1 10/28/2021 0722   LDLCALC 83 10/28/2021 0722     Risk Assessment/Calculations:      Physical Exam:    VS:  BP 128/80   Pulse 74   Ht '5\' 7"'$  (1.702 m)   Wt 129 lb 3.2 oz (58.6 kg)   SpO2 98%   BMI 20.24 kg/m     Wt Readings from Last 3 Encounters:  11/05/22 129 lb 3.2 oz (58.6 kg)  10/28/22 130 lb (59 kg)  07/09/21 128 lb 12.8 oz (58.4 kg)     GEN:  Well nourished, well developed in no acute distress HEENT: Normal NECK: No JVD; No carotid bruits CARDIAC: RRR, no murmurs, rubs, gallops RESPIRATORY:  Clear to auscultation without rales, wheezing or rhonchi  ABDOMEN: Soft, non-tender, non-distended MUSCULOSKELETAL:  No edema; No deformity. 2+ pedal pulses, equal bilaterally SKIN: Warm and dry NEUROLOGIC:  Alert and oriented x 3 PSYCHIATRIC:  Normal affect   EKG:  EKG is not ordered today.    Diagnoses:    1. Precordial pain   2. Benign essential HTN   3. Coronary artery disease involving native coronary artery of native heart without angina pectoris   4. Shortness of breath   5. Hyperlipidemia LDL goal <70    Assessment and Plan:     Chest pain: Recent episodes of chest tightness as noted above. No indication of ACS at ED visit on 10/28/22. Symptoms do not worsen with exertion. We will get coronary CTA for mapping of coronary anatomy. Will have her take Lopressor 50 mg 2 hours prior.   Shortness of breath: Remains very active. Noted most often when getting up off floor after exercise or playing with grandchildren. Interestingly, HR went up 20 bpm on orthostatic VS today. Also reports weight gain in waist and upper thighs, however weight is stable per her report. No orthopnea, PND. Will have monitor HR with exercise, particularly with position changes. We will update echocardiogram for evaluation of heart and valve function. Consider cardiac monitor for evaluation of SOB that may be associated with tachycardia.   CAD without angina: CT calcium score  2020 with calcium score 182 (83%). Decreased asa to every other day due to  bruising. Will get coronary CTA as noted above. Management of hyperlipidemia has been complicated by intolerance of medications. Will further risk stratify following CT.   Hyperlipidemia LDL goal < 70: LDL 97 on 01/16/2022. Had lipid panel with PCP 11/04/22. Will send [place] results.  Continue low-dose atorvastatin.  Hypertension: BP is well-controlled. I reviewed home BP readings which also demonstrate good control.    Disposition: 2 months with Dr. Radford Pax or me  Medication Adjustments/Labs and Tests Ordered: Current medicines are reviewed at length with the patient today.  Concerns regarding medicines are outlined above.  Orders Placed This Encounter  Procedures   CT CORONARY MORPH W/CTA COR W/SCORE W/CA W/CM &/OR WO/CM   Basic Metabolic Panel (BMET)   ECHOCARDIOGRAM COMPLETE   Meds ordered this encounter  Medications   metoprolol tartrate (LOPRESSOR) 50 MG tablet    Sig: Take one (1) tablet by mouth ( 50 mg) 2 hours prior to CT scan.    Dispense:  1 tablet    Refill:  0    Patient Instructions  Medication Instructions:   Your physician recommends that you continue on your current medications as directed. Please refer to the Current Medication list given to you today.   *If you need a refill on your cardiac medications before your next appointment, please call your pharmacy*   Lab Work:  Your physician recommends that you return for lab work only if needed for Coronary CTA. Can walk into [facility].  Paperwork given today.    If you have labs (blood work) drawn today and your tests are completely normal, you will receive your results only by: [place] (if you have MyChart) OR A paper copy in the mail If you have any lab test that is abnormal or we need to change your treatment, we will call you to review the results.   Testing/Procedures:    Your cardiac CT will be scheduled at one of the  below locations:   [facility] [address] [place], [place] [address] [phone]  If scheduled at [facility], please arrive at the [facility] and Children's Entrance (Entrance C2) of [facility] 30 minutes prior to test start time. You can use the FREE valet parking offered at entrance C (encouraged to control the heart rate for the test)  Proceed to the [facility] Radiology Department (first floor) to check-in and test prep.  All radiology patients and guests should use entrance C2 at [facility], accessed from [facility], even though the hospital's physical address listed is [address]    Please follow these instructions carefully (unless otherwise directed):   On the Night Before the Test: Be sure to Drink plenty of water. Do not consume any caffeinated/decaffeinated beverages or chocolate 12 hours prior to your test. Do not take any antihistamines 12 hours prior to your test. If the patient has contrast allergy:   On the Day of the Test: Drink plenty of water until 1 hour prior to the test. Do not eat any food 1 hour prior to test. You may take your regular medications prior to the test.  Take metoprolol (Lopressor)one tablet ( 50 mg)  two hours prior to test. FEMALES- please wear underwire-free bra if available, avoid dresses & tight clothing     After the Test: Drink plenty of water. After receiving IV contrast, you may experience a mild flushed feeling. This is normal. On occasion, you may experience a mild rash up to  24 hours after the test. This is not dangerous. If this occurs, you can take Benadryl 25 mg and increase your fluid intake. If you experience trouble breathing, this can be serious. If it is severe call 911 IMMEDIATELY. If it is mild, please call our office.   We will call to schedule your test 2-4 weeks out understanding that some insurance companies will need an authorization prior to the  service being performed.   For non-scheduling related questions, please contact the cardiac imaging nurse navigator should you have any questions/concerns: Marchia Bond, Cardiac Imaging Nurse Navigator Gordy Clement, Cardiac Imaging Nurse Navigator [facility] Direct Office Dial: [phone]   For scheduling needs, including cancellations and rescheduling, please call [place], [phone].   Your physician has requested that you have an echocardiogram. Echocardiography is a painless test that uses sound waves to create images of your heart. It provides your doctor with information about the size and shape of your heart and how well your heart's chambers and valves are working. This procedure takes approximately one hour. There are no restrictions for this procedure. Please do NOT wear cologne, perfume,  or lotions (deodorant is allowed). Please arrive 15 minutes prior to your appointment time.    Follow-Up: At [facility], you and your health needs are our priority.  As part of our continuing mission to provide you with exceptional heart care, we have created designated Provider Care Teams.  These Care Teams include your primary Cardiologist (physician) and Advanced Practice Providers (APPs -  Physician Assistants and Nurse Practitioners) who all work together to provide you with the care you need, when you need it.  We recommend signing up for the patient portal called "MyChart".  Sign up information is provided on this After Visit Summary.  MyChart is used to connect with patients for Virtual Visits (Telemedicine).  Patients are able to view lab/test results, encounter notes, upcoming appointments, etc.  Non-urgent messages can be sent to your provider as well.   To learn more about what you can do with MyChart, go to [email or web address].    Your next appointment:   2 month(s)  Provider:   Fransico Him, MD       Signed, Emmaline Life,  NP  11/05/2022 5:24 PM    [place]

## 2022-11-05 ENCOUNTER — Other Ambulatory Visit: Payer: Self-pay | Admitting: *Deleted

## 2022-11-05 ENCOUNTER — Encounter: Payer: Self-pay | Admitting: Nurse Practitioner

## 2022-11-05 ENCOUNTER — Ambulatory Visit: Payer: Medicare Other | Attending: Nurse Practitioner | Admitting: Nurse Practitioner

## 2022-11-05 VITALS — BP 128/80 | HR 74 | Ht 67.0 in | Wt 129.2 lb

## 2022-11-05 DIAGNOSIS — I2510 Atherosclerotic heart disease of native coronary artery without angina pectoris: Secondary | ICD-10-CM

## 2022-11-05 DIAGNOSIS — R0602 Shortness of breath: Secondary | ICD-10-CM

## 2022-11-05 DIAGNOSIS — I10 Essential (primary) hypertension: Secondary | ICD-10-CM

## 2022-11-05 DIAGNOSIS — E785 Hyperlipidemia, unspecified: Secondary | ICD-10-CM

## 2022-11-05 DIAGNOSIS — R072 Precordial pain: Secondary | ICD-10-CM

## 2022-11-05 DIAGNOSIS — E7800 Pure hypercholesterolemia, unspecified: Secondary | ICD-10-CM | POA: Diagnosis not present

## 2022-11-05 MED ORDER — METOPROLOL TARTRATE 50 MG PO TABS: ORAL_TABLET | ORAL | 0 refills | Status: DC

## 2022-11-05 NOTE — Patient Instructions (Signed)
Medication Instructions:   Your physician recommends that you continue on your current medications as directed. Please refer to the Current Medication list given to you today.   *If you need a refill on your cardiac medications before your next appointment, please call your pharmacy*   Lab Work:  Your physician recommends that you return for lab work only if needed for Coronary CTA. Can walk into [facility].  Paperwork given today.    If you have labs (blood work) drawn today and your tests are completely normal, you will receive your results only by: [place] (if you have MyChart) OR A paper copy in the mail If you have any lab test that is abnormal or we need to change your treatment, we will call you to review the results.   Testing/Procedures:    Your cardiac CT will be scheduled at one of the below locations:   [facility] [address] [place], [place] [address] [phone]  If scheduled at [facility], please arrive at the [facility] and Children's Entrance (Entrance C2) of [facility] 30 minutes prior to test start time. You can use the FREE valet parking offered at entrance C (encouraged to control the heart rate for the test)  Proceed to the [facility] Radiology Department (first floor) to check-in and test prep.  All radiology patients and guests should use entrance C2 at [facility], accessed from [facility], even though the hospital's physical address listed is [address]    Please follow these instructions carefully (unless otherwise directed):   On the Night Before the Test: Be sure to Drink plenty of water. Do not consume any caffeinated/decaffeinated beverages or chocolate 12 hours prior to your test. Do not take any antihistamines 12 hours prior to your test. If the patient has contrast allergy:   On the Day of the Test: Drink plenty of water until 1 hour prior to the test. Do not  eat any food 1 hour prior to test. You may take your regular medications prior to the test.  Take metoprolol (Lopressor)one tablet ( 50 mg)  two hours prior to test. FEMALES- please wear underwire-free bra if available, avoid dresses & tight clothing     After the Test: Drink plenty of water. After receiving IV contrast, you may experience a mild flushed feeling. This is normal. On occasion, you may experience a mild rash up to 24 hours after the test. This is not dangerous. If this occurs, you can take Benadryl 25 mg and increase your fluid intake. If you experience trouble breathing, this can be serious. If it is severe call 911 IMMEDIATELY. If it is mild, please call our office.   We will call to schedule your test 2-4 weeks out understanding that some insurance companies will need an authorization prior to the service being performed.   For non-scheduling related questions, please contact the cardiac imaging nurse navigator should you have any questions/concerns: Marchia Bond, Cardiac Imaging Nurse Navigator Gordy Clement, Cardiac Imaging Nurse Navigator [facility] Direct Office Dial: [phone]   For scheduling needs, including cancellations and rescheduling, please call [place], [phone].   Your physician has requested that you have an echocardiogram. Echocardiography is a painless test that uses sound waves to create images of your heart. It provides your doctor with information about the size and shape of your heart and how well your heart's chambers and valves are working. This procedure takes approximately  one hour. There are no restrictions for this procedure. Please do NOT wear cologne, perfume,  or lotions (deodorant is allowed). Please arrive 15 minutes prior to your appointment time.    Follow-Up: At [facility], you and your health needs are our priority.  As part of our continuing mission to provide you with exceptional heart  care, we have created designated Provider Care Teams.  These Care Teams include your primary Cardiologist (physician) and Advanced Practice Providers (APPs -  Physician Assistants and Nurse Practitioners) who all work together to provide you with the care you need, when you need it.  We recommend signing up for the patient portal called "MyChart".  Sign up information is provided on this After Visit Summary.  MyChart is used to connect with patients for Virtual Visits (Telemedicine).  Patients are able to view lab/test results, encounter notes, upcoming appointments, etc.  Non-urgent messages can be sent to your provider as well.   To learn more about what you can do with MyChart, go to [email or web address].    Your next appointment:   2 month(s)  Provider:   Fransico Him, MD

## 2022-11-10 DIAGNOSIS — K080 Exfoliation of teeth due to systemic causes: Secondary | ICD-10-CM | POA: Diagnosis not present

## 2022-11-16 ENCOUNTER — Telehealth (HOSPITAL_COMMUNITY): Payer: Self-pay | Admitting: *Deleted

## 2022-11-16 NOTE — Telephone Encounter (Signed)
Reaching out to patient to offer assistance regarding upcoming cardiac imaging study; pt verbalizes understanding of appt date/time, parking situation and where to check in, pre-test NPO status and medications ordered, and verified current allergies; name and call back number provided for further questions should they arise  Kazi Montoro RN Navigator Cardiac Imaging [facility] [phone] office [phone] cell  Patient to take 50mg metoprolol tartrate two hours prior to her cardiac CT scan. She is aware to arrive at 2:30pm.  

## 2022-11-17 ENCOUNTER — Ambulatory Visit (HOSPITAL_COMMUNITY)
Admission: RE | Admit: 2022-11-17 | Discharge: 2022-11-17 | Disposition: A | Discharge status: Home / Self Care | Payer: Medicare Other | Source: Ambulatory Visit | Attending: Nurse Practitioner | Admitting: Nurse Practitioner

## 2022-11-17 DIAGNOSIS — R072 Precordial pain: Secondary | ICD-10-CM | POA: Diagnosis not present

## 2022-11-17 MED ORDER — NITROGLYCERIN 0.4 MG SL SUBL
SUBLINGUAL_TABLET | SUBLINGUAL | Status: AC
  Filled 2022-11-17: qty 2

## 2022-11-17 MED ORDER — NITROGLYCERIN 0.4 MG SL SUBL
0.8000 mg | SUBLINGUAL_TABLET | Freq: Once | SUBLINGUAL | Status: AC
  Administered 2022-11-17: 0.8 mg via SUBLINGUAL

## 2022-11-17 MED ORDER — IOHEXOL 350 MG/ML SOLN
100.0000 mL | Freq: Once | INTRAVENOUS | Status: AC | PRN
  Administered 2022-11-17: 100 mL via INTRAVENOUS

## 2022-11-20 ENCOUNTER — Telehealth: Payer: Self-pay | Admitting: Nurse Practitioner

## 2022-11-20 NOTE — Telephone Encounter (Signed)
Patient dropped off lipid results for Patricia [name]. Patient asked if she could be notified when they are received. Form will be placed in box with mail this afternoon.

## 2022-11-24 ENCOUNTER — Ambulatory Visit: Payer: Medicare Other | Admitting: Cardiology

## 2022-11-30 DIAGNOSIS — M816 Localized osteoporosis [Lequesne]: Secondary | ICD-10-CM | POA: Diagnosis not present

## 2022-12-01 DIAGNOSIS — K080 Exfoliation of teeth due to systemic causes: Secondary | ICD-10-CM | POA: Diagnosis not present

## 2022-12-03 ENCOUNTER — Ambulatory Visit (HOSPITAL_COMMUNITY): Payer: Medicare Other | Attending: Internal Medicine

## 2022-12-03 DIAGNOSIS — I2510 Atherosclerotic heart disease of native coronary artery without angina pectoris: Secondary | ICD-10-CM

## 2022-12-03 DIAGNOSIS — R072 Precordial pain: Secondary | ICD-10-CM | POA: Diagnosis not present

## 2022-12-03 DIAGNOSIS — I10 Essential (primary) hypertension: Secondary | ICD-10-CM

## 2022-12-03 LAB — ECHOCARDIOGRAM COMPLETE
Area-P 1/2: 3.15 cm2
S' Lateral: 2.4 cm

## 2022-12-04 ENCOUNTER — Ambulatory Visit: Payer: Medicare Other | Admitting: Cardiology

## 2022-12-04 MED ORDER — AMLODIPINE BESYLATE 2.5 MG PO TABS: 2.5000 mg | ORAL_TABLET | Freq: Every day | ORAL | 3 refills | Status: DC

## 2022-12-09 DIAGNOSIS — K080 Exfoliation of teeth due to systemic causes: Secondary | ICD-10-CM | POA: Diagnosis not present

## 2022-12-10 DIAGNOSIS — K080 Exfoliation of teeth due to systemic causes: Secondary | ICD-10-CM | POA: Diagnosis not present

## 2022-12-14 ENCOUNTER — Ambulatory Visit: Payer: Medicare Other | Admitting: Podiatry

## 2022-12-14 ENCOUNTER — Ambulatory Visit (INDEPENDENT_AMBULATORY_CARE_PROVIDER_SITE_OTHER): Payer: Medicare Other

## 2022-12-14 DIAGNOSIS — M722 Plantar fascial fibromatosis: Secondary | ICD-10-CM

## 2022-12-14 DIAGNOSIS — M7672 Peroneal tendinitis, left leg: Secondary | ICD-10-CM | POA: Diagnosis not present

## 2022-12-14 MED ORDER — MELOXICAM 7.5 MG PO TABS: 7.5000 mg | ORAL_TABLET | Freq: Every day | ORAL | 0 refills | Status: DC

## 2022-12-14 NOTE — Patient Instructions (Signed)

## 2022-12-14 NOTE — Progress Notes (Unsigned)
Subjective: Chief Complaint  Patient presents with   Tendonitis    left foot/ankle pain going up leg. PF brace helped the bottom of her foot. For the last 3 months, she has had pain on both sides of heel, and it radiates up her leg   72 year old female presents the office for above concerns.  She states she has not been pain more to the bottom of her left heel over the last 3 months.  She has skin pain on the both sides of the heel and is starting to go up the leg.  No recent injury or changes.  Objective: AAO x3, NAD DP/PT pulses palpable bilaterally, CRT less than 3 seconds Tenderness palpation is along the plantar medial tubercle of the calcaneus at the insertion of the plantar fascia of the left side but she is on both sides of the heel.  She is getting some discomfort along the course of the peroneal tendon.  No significant pain along the course of the flexor tendon, medial aspect.  No area pinpoint tenderness.  Negative Tinel sign.  MMT 5/5. No pain with calf compression, swelling, warmth, erythema  Assessment: 72 year old female with plantar fasciitis  Plan: -All treatment options discussed with the patient including all alternatives, risks, complications.  -Prescribed mobic. Discussed side effects of the medication and directed to stop if any are to occur and call the office.  -She has a plantar fascia brace ordered for her to continue with. -Dispensed a night splint to help stretch the plantar fascia as well. -Stretching, icing on regular basis -Continue shoes and good arch support. -If no improvement consider MRI, advanced imaging. -Patient encouraged to call the office with any questions, concerns, change in symptoms.   Vivi Barrack DPM

## 2022-12-23 DIAGNOSIS — H2513 Age-related nuclear cataract, bilateral: Secondary | ICD-10-CM | POA: Diagnosis not present

## 2022-12-23 DIAGNOSIS — H18513 Endothelial corneal dystrophy, bilateral: Secondary | ICD-10-CM | POA: Diagnosis not present

## 2022-12-30 ENCOUNTER — Encounter: Payer: Self-pay | Admitting: *Deleted

## 2022-12-30 DIAGNOSIS — Z006 Encounter for examination for normal comparison and control in clinical research program: Secondary | ICD-10-CM

## 2022-12-30 NOTE — Research (Signed)
Message left for Patricia [name] about V1P Encouraged her to call with any questions.

## 2023-01-04 NOTE — Progress Notes (Unsigned)
Cardiology Office Note:    Date:  01/05/2023   ID:  Patricia [name], DOB 1951-01-14, MRN [id number]  PCP:  Renford Dills, MD  Cardiologist:  Armanda Magic, MD    Referring MD: Renford Dills, MD   Chief Complaint  Patient presents with   Follow-up    Coronary artery calcifications, HTN, HLD     History of Present Illness:    Patricia [name] is a 72 y.o. female with a hx of coronary artery calcifications by Chest CT with a Ca score of 182 placing her in the 84th percentile for age and sex matched controls. She has not had a stress test since she has been asymptomatic.  She also has HTN and HLD. She was started on ASA 81mg  daily and Atorvastatin 10mg  daily due to LDL 140.    She was seen in the ER in March 24 due to chest pressure in the center of her chest that was intermittent.  It was right sided with radiation into her right shoulder.  The pain would last 1-2 minutes but would be in the am before going to the gym but would not get it while exercising at the gym.  The day of ER visit she was sitting in the car and had a pressure that went around into her back.  In ER She was evaluated by Cardiology in the ER and felt to be atypical.  She ultimately underwent coronary CTA which demonstrated aortic atherosclerosis with coronary Ca score of 312 and minimal <25% plaque in the pLAD (positive remodeling)/LCx and RCA. 2D echo was normal as well 11/2022.  She is here today for followup and is doing well. She denies any further CP.  She denies any SOB, DOE, PND, orthopnea, LE edema, dizziness, palpitations or syncope. Her main complaint is exertional fatigue that she gets when working out. She is compliant with her meds and is tolerating meds with no SE.    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   amLODipine (NORVASC) 2.5 MG tablet Take 1 tablet (2.5 mg total) by mouth daily.   atorvastatin (LIPITOR) 20 MG tablet Take 1 tablet (20 mg total) by mouth daily.   Biotin 1000 MCG CHEW Take 1 tablet by mouth daily.   CALCIUM CARBONATE-VITAMIN D PO Take 1 tablet by mouth daily.   carboxymethylcellulose (REFRESH PLUS) 0.5 % SOLN Place 1 drop into both eyes daily as needed (dry eyes).   Cholecalciferol 50 MCG (2000 UT) TABS Take 1 tablet by mouth daily.   fexofenadine (ALLEGRA) 180 MG tablet Take 180 mg by mouth as needed for allergies or rhinitis.   fluticasone (FLONASE) 50 MCG/ACT nasal spray Place  into both nostrils as needed for allergies or rhinitis.   losartan (COZAAR) 25 MG tablet TAKE 1 TABLET(25 MG) BY MOUTH DAILY   meloxicam (MOBIC) 7.5 MG tablet Take 1 tablet (7.5 mg total) by mouth daily.   Menaquinone-7 (VITAMIN K2 PO) Take by mouth. Per patient taking 30 mcg daily   Multiple Vitamin (MULTIVITAMIN) tablet Take 1 tablet by mouth daily.   Omega 3-6-9 Fatty Acids (OMEGA 3-6-9 COMPLEX PO) Take 1 tablet by mouth daily.   sodium chloride (OCEAN) 0.65 % SOLN nasal spray Place 1 spray into both nostrils as needed for congestion.   vitamin B-12 (CYANOCOBALAMIN) 500 MCG tablet Chew 1 tablet by mouth daily.     Allergies:   Doxycycline, Atorvastatin, Ezetimibe, and Rosuvastatin   Social History   Socioeconomic History   Marital status: Married    Spouse name: Not on file    Number of children: Not on file   Years of education: Not on file   Highest education level: Not on file  Occupational History   Not on file  Tobacco Use   Smoking status: Never   Smokeless tobacco: Never  Substance and Sexual Activity   Alcohol use: Yes    Alcohol/week: 2.0 - 3.0 standard drinks of alcohol    Types: 2 - 3 Glasses of wine per week   Drug use: No   Sexual activity: Not on file  Other Topics Concern   Not on file  Social History Narrative   Not on file   Social Determinants of Health   Financial Resource Strain: Not on file  Food Insecurity: Not on file  Transportation Needs: Not on file  Physical Activity: Not on file  Stress: Not on file  Social Connections: Not on file     Family History: The patient's  family history includes COPD in her mother; Heart attack in her father.  ROS:   Please see the history of present illness.    ROS  All other systems reviewed and negative.   EKGs/Labs/Other Studies Reviewed:    The following studies were reviewed today:  EKG:  EKG is not ordered today.    Recent Labs: 10/28/2022: BUN 19; Creatinine, Ser 0.87; Hemoglobin 13.1; Platelets 213; Potassium 4.2; Sodium 135   Recent Lipid Panel    Component Value Date/Time   CHOL 179 10/28/2021 0722   TRIG 66 10/28/2021 0722   HDL 84 10/28/2021 0722   CHOLHDL 2.1 10/28/2021 0722   LDLCALC 83 10/28/2021 0722          Physical Exam:    VS:  BP (!) 144/86   Pulse 81   Ht 5\' 7"  (1.702 m)   Wt 130 lb 6.4 oz (59.1 kg)   SpO2 96%   BMI 20.42 kg/m     Wt Readings from Last 3 Encounters:  01/05/23 130 lb 6.4 oz (59.1 kg)  11/05/22 129 lb 3.2 oz (58.6 kg)  10/28/22 130 lb (59 kg)  GEN: Well nourished, well developed in no acute distress HEENT: Normal NECK: No JVD; No carotid bruits LYMPHATICS: No lymphadenopathy CARDIAC:RRR, no murmurs, rubs, gallops RESPIRATORY:  Clear to auscultation without rales, wheezing or rhonchi  ABDOMEN: Soft, non-tender,  non-distended.  Pulsatile mass in abdomen although does not feel enlarged MUSCULOSKELETAL:  No edema; No deformity  SKIN: Warm and dry NEUROLOGIC:  Alert and oriented x 3 PSYCHIATRIC:  Normal affect  ASSESSMENT:    1. Coronary artery disease involving native coronary artery of native heart, unspecified whether angina present  2. Benign essential HTN   3. Hyperlipidemia LDL goal <70    PLAN:    In order of problems listed above:  1. ASCAD -coronary CTA which demonstrated aortic atherosclerosis with coronary Ca score of 312 and minimal <25% plaque in the pLAD (positive remodeling)/LCx and RCA 10/2022 -2D echo with normal LVF 2024 -She has not had any further CP since her ER visit in 10/2022 -continue ASA and statin  2.  HTN -BP is adequately controlled on exam today -Continue prescription drug management with amlodipine 2.5 mg daily, losartan 25 mg daily with refills   3.  HLD -LDL goal less than 70 -She was on Atorvastatin but had an episode of confusion and her husband was concerned it was the statin and her PCP changed her to Crestor.  Now have foot cramps on Crestor so that was stopped and started back on low dose atorvastatin -I have personally reviewed and interpreted outside labs performed by patient's PCP which showed LDL 87 and HDL 99 on 11/05/2022 -Continue prescription drug management with atorvastatin 20 mg daily -she is intolerant to higher doses of statins and Zetia -refer to lipid clinic  4.  Fatigue -unclear etiology.  She has a myriad of vague sx including increased abdominal fullness and bloating going into her thighs as well as getting worn out easily -recent Hbg and TSH were normal.  Unclear etiology of sx -she does have a prominent aorta on exam so will get an abdominal US -encouraged her to see her GYN as she still has her ovaries and I am concerned in regards to abdominal swelling and bloating symptoms  Followup with me in 1 year   Medication  Adjustments/Labs and Tests Ordered: Current medicines are reviewed at length with the patient today.  Concerns regarding medicines are outlined above.  No orders of the defined types were placed in this encounter.   No orders of the defined types were placed in this encounter.   Signed, Armanda Magic, MD  01/05/2023 8:44 AM    [facility]

## 2023-01-05 ENCOUNTER — Encounter: Payer: Self-pay | Admitting: Cardiology

## 2023-01-05 ENCOUNTER — Ambulatory Visit: Payer: Medicare Other | Attending: Cardiology | Admitting: Cardiology

## 2023-01-05 VITALS — BP 144/86 | HR 81 | Ht 67.0 in | Wt 130.4 lb

## 2023-01-05 DIAGNOSIS — R1900 Intra-abdominal and pelvic swelling, mass and lump, unspecified site: Secondary | ICD-10-CM | POA: Diagnosis not present

## 2023-01-05 DIAGNOSIS — I2510 Atherosclerotic heart disease of native coronary artery without angina pectoris: Secondary | ICD-10-CM | POA: Diagnosis not present

## 2023-01-05 DIAGNOSIS — I10 Essential (primary) hypertension: Secondary | ICD-10-CM

## 2023-01-05 DIAGNOSIS — E785 Hyperlipidemia, unspecified: Secondary | ICD-10-CM

## 2023-01-05 NOTE — Patient Instructions (Addendum)
Medication Instructions:  Your physician recommends that you continue on your current medications as directed. Please refer to the Current Medication list given to you today.  *If you need a refill on your cardiac medications before your next appointment, please call your pharmacy*  Lab Work: None ordered  Testing/Procedures: Your physician has requested that you have an abdominal aorta duplex. During this test, an ultrasound is used to evaluate the aorta. Allow 30 minutes for this exam. Do not eat after midnight the day before and avoid carbonated beverages   Follow-Up: At [facility], you and your health needs are our priority.  As part of our continuing mission to provide you with exceptional heart care, we have created designated Provider Care Teams.  These Care Teams include your primary Cardiologist (physician) and Advanced Practice Providers (APPs -  Physician Assistants and Nurse Practitioners) who all work together to provide you with the care you need, when you need it.  Your next appointment:   1 year(s)  The format for your next appointment:   In Person  Provider:   Armanda Magic, MD {  Other Instructions Your physician has referred you to our Lipid Clinic, you will meet with our Pharmacist to discuss options for cholesterol management. A scheduler will call you to schedule this appointment.

## 2023-01-05 NOTE — Addendum Note (Signed)
Addended by: Franchot Gallo on: 01/05/2023 09:02 AM   Modules accepted: Orders

## 2023-01-20 ENCOUNTER — Ambulatory Visit (HOSPITAL_COMMUNITY)
Admission: RE | Admit: 2023-01-20 | Discharge: 2023-01-20 | Disposition: A | Discharge status: Home / Self Care | Payer: Medicare Other | Source: Ambulatory Visit | Attending: Cardiology | Admitting: Cardiology

## 2023-01-20 DIAGNOSIS — R1900 Intra-abdominal and pelvic swelling, mass and lump, unspecified site: Secondary | ICD-10-CM | POA: Insufficient documentation

## 2023-01-22 ENCOUNTER — Telehealth: Payer: Self-pay

## 2023-01-22 NOTE — Telephone Encounter (Signed)
Called to discuss abdominal ultrasound results. No answer, left detailed message per DPR that scan showed normal sized aorta. Advised patient to call the office if she has any questions.

## 2023-01-22 NOTE — Telephone Encounter (Signed)
-----   Message from Meriam Sprague, MD sent at 01/22/2023  1:28 PM EDT ----- Abdominal ultrasound is reassuring with normal sized aorta. This is great news!

## 2023-01-26 ENCOUNTER — Ambulatory Visit: Payer: Medicare Other

## 2023-01-28 ENCOUNTER — Emergency Department (HOSPITAL_COMMUNITY)
Admission: EM | Admit: 2023-01-28 | Discharge: 2023-01-28 | Disposition: A | Discharge status: Home / Self Care | Payer: Medicare Other | Attending: Emergency Medicine | Admitting: Emergency Medicine

## 2023-01-28 ENCOUNTER — Other Ambulatory Visit: Payer: Self-pay

## 2023-01-28 ENCOUNTER — Encounter (HOSPITAL_COMMUNITY): Payer: Self-pay | Admitting: Emergency Medicine

## 2023-01-28 ENCOUNTER — Emergency Department (HOSPITAL_COMMUNITY): Payer: Medicare Other

## 2023-01-28 DIAGNOSIS — R079 Chest pain, unspecified: Secondary | ICD-10-CM | POA: Insufficient documentation

## 2023-01-28 DIAGNOSIS — R0789 Other chest pain: Secondary | ICD-10-CM | POA: Diagnosis not present

## 2023-01-28 DIAGNOSIS — R231 Pallor: Secondary | ICD-10-CM | POA: Diagnosis not present

## 2023-01-28 DIAGNOSIS — I10 Essential (primary) hypertension: Secondary | ICD-10-CM | POA: Insufficient documentation

## 2023-01-28 DIAGNOSIS — Z7982 Long term (current) use of aspirin: Secondary | ICD-10-CM | POA: Diagnosis not present

## 2023-01-28 DIAGNOSIS — I340 Nonrheumatic mitral (valve) insufficiency: Secondary | ICD-10-CM

## 2023-01-28 DIAGNOSIS — Z0389 Encounter for observation for other suspected diseases and conditions ruled out: Secondary | ICD-10-CM | POA: Diagnosis not present

## 2023-01-28 DIAGNOSIS — Z79899 Other long term (current) drug therapy: Secondary | ICD-10-CM | POA: Diagnosis not present

## 2023-01-28 LAB — CBC
HCT: 38.1 % (ref 36.0–46.0)
Hemoglobin: 12.6 g/dL (ref 12.0–15.0)
MCH: 30.9 pg (ref 26.0–34.0)
MCHC: 33.1 g/dL (ref 30.0–36.0)
MCV: 93.4 fL (ref 80.0–100.0)
Platelets: 215 10*3/uL (ref 150–400)
RBC: 4.08 MIL/uL (ref 3.87–5.11)
RDW: 13.3 % (ref 11.5–15.5)
WBC: 4.2 10*3/uL (ref 4.0–10.5)
nRBC: 0 % (ref 0.0–0.2)

## 2023-01-28 LAB — BASIC METABOLIC PANEL
Anion gap: 10 (ref 5–15)
BUN: 20 mg/dL (ref 8–23)
CO2: 21 mmol/L — ABNORMAL LOW (ref 22–32)
Calcium: 8.7 mg/dL — ABNORMAL LOW (ref 8.9–10.3)
Chloride: 104 mmol/L (ref 98–111)
Creatinine, Ser: 0.9 mg/dL (ref 0.44–1.00)
GFR, Estimated: >60 mL/min (ref >60)
Glucose, Bld: 95 mg/dL (ref 70–99)
Potassium: 4 mmol/L (ref 3.5–5.1)
Sodium: 135 mmol/L (ref 135–145)

## 2023-01-28 LAB — TROPONIN I (HIGH SENSITIVITY)
Troponin I (High Sensitivity): 4 ng/L (ref <18)
Troponin I (High Sensitivity): 7 ng/L (ref <18)

## 2023-01-28 MED ORDER — NITROGLYCERIN 2 % TD OINT
0.5000 [in_us] | TOPICAL_OINTMENT | Freq: Once | TRANSDERMAL | Status: AC
  Administered 2023-01-28: 0.5 [in_us] via TOPICAL
  Filled 2023-01-28: qty 1

## 2023-01-28 NOTE — ED Provider Notes (Signed)
Care of patient received from prior provider at 8:24 PM, please see their note for complete H/P and care plan.  Received handoff per ED course.  Clinical Course as of 01/28/23 2024  Thu Jan 28, 2023  1615 Trop then cards consult HS 4 EKG OK. [CC]  1636 Cardiology consulted: [facility]  1726 Cardiology consulted at bedside. She had a coronary CT outpatient that was low risk and symptoms have resolved on their evaluation.  They feel comfortable outpatient care management pending a second troponin. [CC]    Clinical Course User Index [CC] Glyn Ade, MD    Reassessment: Second troponin without elevation.  All symptoms resolved over the past 4 hours.  Patient ambulatory tolerating p.o. intake in no acute distress.  No acute indication for further intervention at this time.  Plan for follow-up in the outpatient setting for ongoing care and management.  Disposition:  I have considered need for hospitalization, however, considering all of the above, I believe this patient is stable for discharge at this time.  Patient/family educated about specific return precautions for given chief complaint and symptoms.  Patient/family educated about follow-up with PCP and cardiology.     Patient/family expressed understanding of return precautions and need for follow-up. Patient spoken to regarding all imaging and laboratory results and appropriate follow up for these results. All education provided in verbal form with additional information in written form. Time was allowed for answering of patient questions. Patient discharged.    Emergency Department Medication Summary:   Medications  nitroGLYCERIN (NITROGLYN) 2 % ointment 0.5 inch (0.5 inches Topical Given 01/28/23 1757)          Glyn Ade, MD 01/28/23 2026

## 2023-01-28 NOTE — ED Provider Notes (Signed)
[place] EMERGENCY DEPARTMENT AT [facility] Provider Note   CSN: [id number] Arrival date & time: 01/28/23  1515     History  Chief Complaint  Patient presents with   Chest Pain    Sheliah Fiorillo is a 72 y.o. female with a history of hypertension and hyperlipidemia who presents to the ED today for chest pain. Patient reports that this morning she had a transient episode of sharp, stabbing pain under her left breast that lasted about 6 minutes. About an hour prior to arrival, patient developed the same pain again except this time it has not resolved. The pain does not radiate and she denies any associated shortness of breath. She took 4 baby Aspirin prior to EMS arrival to her house and she was given Nitro in transit with some relief, but she still reports pain is about a 7/10 at this time.  Last coronary calcium score: 312 Last echocardiogram LVEF: 60-65% Heart Score: 4    Home Medications Prior to Admission medications   Medication Sig Start Date End Date Taking? Authorizing Provider  acetaminophen (TYLENOL) 325 MG tablet Take 650 mg by mouth every 6 (six) hours as needed for mild pain or headache.    [provider]  alendronate (FOSAMAX) 70 MG tablet Take 70 mg by mouth once a week. Take with a full glass of water on an empty stomach. Patient not taking: Reported on 01/05/2023    [provider]  amLODipine (NORVASC) 2.5 MG tablet Take 1 tablet (2.5 mg total) by mouth daily. 12/04/22   Swinyer, Zachary George, NP  Ascorbic Acid (VITAMIN C) 1000 MG tablet Take 500 mg by mouth daily. Patient not taking: Reported on 01/05/2023    [provider]  aspirin EC 81 MG tablet Take 81 mg by mouth every other day. Bruising on daily dosing Patient not taking: Reported on 01/05/2023    [provider]  atorvastatin (LIPITOR) 20 MG tablet Take 1 tablet (20 mg total) by mouth daily. 12/10/21   Quintella Reichert, MD  Biotin 1000 MCG CHEW Take 1 tablet by mouth  daily.    [provider]  CALCIUM CARBONATE-VITAMIN D PO Take 1 tablet by mouth daily.    [provider]  carboxymethylcellulose (REFRESH PLUS) 0.5 % SOLN Place 1 drop into both eyes daily as needed (dry eyes).    [provider]  Cholecalciferol 50 MCG (2000 UT) TABS Take 1 tablet by mouth daily.    [provider]  fexofenadine (ALLEGRA) 180 MG tablet Take 180 mg by mouth as needed for allergies or rhinitis.    [provider]  fluticasone (FLONASE) 50 MCG/ACT nasal spray Place into both nostrils as needed for allergies or rhinitis.    [provider]  losartan (COZAAR) 25 MG tablet TAKE 1 TABLET(25 MG) BY MOUTH DAILY 03/09/22   Quintella Reichert, MD  meloxicam (MOBIC) 7.5 MG tablet Take 1 tablet (7.5 mg total) by mouth daily. 12/14/22   Vivi Barrack, DPM  Menaquinone-7 (VITAMIN K2 PO) Take by mouth. Per patient taking 30 mcg daily    [provider]  metoprolol tartrate (LOPRESSOR) 50 MG tablet Take one (1) tablet by mouth ( 50 mg) 2 hours prior to CT scan. Patient not taking: Reported on 01/05/2023 11/05/22   Swinyer, Zachary George, NP  Multiple Vitamin (MULTIVITAMIN) tablet Take 1 tablet by mouth daily.    [provider]  Omega 3-6-9 Fatty Acids (OMEGA 3-6-9 COMPLEX PO) Take 1 tablet  by mouth daily.    [provider]  sodium chloride (OCEAN) 0.65 % SOLN nasal spray Place 1 spray into both nostrils as needed for congestion.    [provider]  vitamin B-12 (CYANOCOBALAMIN) 500 MCG tablet Chew 1 tablet by mouth daily.    [provider]      Allergies    Doxycycline, Atorvastatin, Ezetimibe, and Rosuvastatin    Review of Systems   Review of Systems  Cardiovascular:  Positive for chest pain.    Physical Exam Updated Vital Signs BP (!) 153/88 (BP Location: Right Arm)   Pulse 60   Temp 97.8 F (36.6 C) (Oral)   Resp 15   Ht 5\' 7"  (1.702 m)   Wt 59 kg   SpO2 100%   BMI 20.36 kg/m   Physical Exam  ED Results / Procedures / Treatments   Labs (all labs ordered are listed, but only abnormal results are displayed) Labs Reviewed  CBC  BASIC METABOLIC PANEL  TROPONIN I (HIGH SENSITIVITY)    EKG EKG Interpretation  Date/Time:  Thursday January 28 2023 15:23:18 EDT Ventricular Rate:  62 PR Interval:  170 QRS Duration: 118 QT Interval:  436 QTC Calculation: 443 R Axis:   92 Text Interpretation: Sinus rhythm Incomplete right bundle branch block since last tracing no significant change  c/w 10/28/22 Confirmed by Eber Hong ([id number]) on 01/28/2023 3:33:44 PM  Radiology DG Chest Portable 1 View  Result Date: 01/28/2023 CLINICAL DATA:  No history is provided. EXAM: PORTABLE CHEST 1 VIEW COMPARISON:  None Available. FINDINGS: The heart size and mediastinal contours are within normal limits. Both lungs are clear. The visualized skeletal structures are unremarkable. IMPRESSION: No active pulmonary disease. Electronically Signed   By: Lorenza Cambridge M.D.   On: 01/28/2023 16:13    Procedures Procedures   Medications Ordered in ED Medications  nitroGLYCERIN (NITROGLYN) 2 % ointment 0.5 inch (has no administration in time range)    ED Course/ Medical Decision Making/ A&P Clinical Course as of 01/28/23 1648  Thu Jan 28, 2023  1615 Trop then cards consult HS 4 EKG OK. [CC]  1636 [facility]iology consulted[facility]    Clinical Course User Index [CC] Glyn Ade, MD                             Medical Decision Making Amount and/or Complexity of Data Reviewed Labs: ordered. Radiology: ordered.   This patient presents to the ED for concern of chest pain, this involves an extensive number of treatment options, and is a complaint that carries with it a high risk of complications and morbidity.   Differential diagnosis includes: ACS, PE, aortic dissection, pneumothorax, pneumonia.   Co morbidities that complicate the patient  evaluation  Hypertension Hyperlipidemia Coronary calcium score of 312   Additional history obtained:  Additional history obtained from cardiology notes.   Cardiac Monitoring / EKG:  The patient was maintained on a cardiac monitor.  I personally viewed and interpreted the cardiac monitored which showed: sinus rhythm with a heart rate of 62 bpm. No significant change from prior EKG   Lab Tests:  I ordered and personally interpreted labs.  The pertinent results include:   Troponin pending at time of shift change. Care taken over by Dr. Glyn Ade.   Imaging Studies ordered:  I ordered imaging studies including CXR  I independently visualized and interpreted imaging which showed:  no active pulmonary disease. Heart is appropriate size and contour. I agree with the radiologist interpretation   Consultations Obtained:  I requested consultation with cardiology,  and discussed lab and imaging findings as well as pertinent plan - they recommend: admit to hospitalist after troponin level returns.   Problem List / ED Course / Critical interventions / Medication management  Chest pain I ordered medications including:  Nitroglycerin paste for chest pain  Reevaluation of the patient after these medicines showed that the patient improved I have reviewed the patients home medicines and have made adjustments as needed    Test / Admission - Considered:  Admit for observation.         Final Clinical Impression(s) / ED Diagnoses Final diagnoses:  Chest pain, unspecified type    Rx / DC Orders ED Discharge Orders     None         Maxwell Marion, PA-C 01/28/23 1648    Eber Hong, MD 01/29/23 [phone]

## 2023-01-28 NOTE — Consult Note (Signed)
Cardiology Consultation   Patient ID: Patricia [name] MRN: [id number]; DOB: February 25, 1951  Admit date: 01/28/2023 Date of Consult: 01/28/2023  PCP:  Renford Dills, MD   [facility] Providers Cardiologist:  Armanda Magic, MD   Patient Profile:   Patricia [name] is a 72 y.o. female with a hx of hypertension, hyperlipidemia who is being seen 01/28/2023 for the evaluation of chest discomfort at the request of Dr. Doran Durand.  History of Present Illness:   Patricia [name] is a 72 year old patient who is typically followed by Dr. Mayford Knife.  She has a history of hypertension and hyperlipidemia. She has a history of coronary artery calcifications with a coronary artery calcium score of 182.  She was last seen in our office on May 14 after having some episodes of chest pressure.  She had a coronary CT angiogram which revealed mild nonobstructive coronary artery narrowings.    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 Authorizing Provider  acetaminophen (TYLENOL) 325 MG tablet Take 650 mg by mouth every 6 (six) hours as needed for mild pain or headache.    [provider]  alendronate (FOSAMAX) 70 MG tablet Take 70 mg by mouth once a week. Take with a full glass of water on an empty stomach. Patient not taking: Reported on 01/05/2023     [provider]  amLODipine (NORVASC) 2.5 MG tablet Take 1 tablet (2.5 mg total) by mouth daily. 12/04/22   Swinyer, Zachary George, NP  Ascorbic Acid (VITAMIN C) 1000 MG tablet Take 500 mg by mouth daily. Patient not taking: Reported on 01/05/2023    [provider]  aspirin EC 81 MG tablet Take 81 mg by mouth every other day. Bruising on daily dosing Patient not taking: Reported on 01/05/2023    [provider]  atorvastatin (LIPITOR) 20 MG tablet Take 1 tablet (20 mg total) by mouth daily. 12/10/21   Quintella Reichert, MD  Biotin 1000 MCG CHEW Take 1 tablet by mouth daily.    [provider]  CALCIUM CARBONATE-VITAMIN D PO Take 1 tablet by mouth daily.    [provider]  carboxymethylcellulose (REFRESH PLUS) 0.5 % SOLN Place 1 drop into both eyes daily as needed (dry eyes).    [provider]  Cholecalciferol 50 MCG (2000 UT) TABS Take 1 tablet by mouth daily.    [provider]  fexofenadine (ALLEGRA) 180 MG tablet Take 180 mg by mouth as needed for allergies or rhinitis.    [provider]  fluticasone (FLONASE) 50 MCG/ACT nasal spray Place into both nostrils as needed for allergies or rhinitis.    [provider]  losartan (COZAAR) 25 MG tablet TAKE 1 TABLET(25 MG) BY MOUTH DAILY 03/09/22   Turner,  Cornelious Bryant, MD  meloxicam (MOBIC) 7.5 MG tablet Take 1 tablet (7.5 mg total) by mouth daily. 12/14/22   Vivi Barrack, DPM  Menaquinone-7 (VITAMIN K2 PO) Take by mouth. Per patient taking 30 mcg daily    [provider]  metoprolol tartrate (LOPRESSOR) 50 MG tablet Take one (1) tablet by mouth ( 50 mg) 2 hours prior to CT scan. Patient not taking: Reported on 01/05/2023 11/05/22   Swinyer, Zachary George, NP  Multiple Vitamin (MULTIVITAMIN) tablet Take 1 tablet by mouth daily.    [provider]  Omega 3-6-9 Fatty Acids (OMEGA 3-6-9 COMPLEX PO) Take 1 tablet by mouth daily.    [provider]  sodium  chloride (OCEAN) 0.65 % SOLN nasal spray Place 1 spray into both nostrils as needed for congestion.    [provider]  vitamin B-12 (CYANOCOBALAMIN) 500 MCG tablet Chew 1 tablet by mouth daily.    [provider]    Inpatient Medications: Scheduled Meds:  nitroGLYCERIN  0.5 inch Topical Once   Continuous Infusions:  PRN Meds:   Allergies:    Allergies  Allergen Reactions   Doxycycline Nausea And Vomiting    Became Dehydrated   Atorvastatin     Constipation, less energy sleep and muscle strength, worse memory, bloating on 40mg  dose. Ok on 20mg      Ezetimibe     Cramping in calves   Rosuvastatin     Cramping in feet    Social History:   Social History   Socioeconomic History   Marital status: Married    Spouse name: Not on file   Number of children: Not on file   Years of education: Not on file   Highest education level: Not on file  Occupational History   Not on file  Tobacco Use   Smoking status: Never   Smokeless tobacco: Never  Substance and Sexual Activity   Alcohol use: Yes    Alcohol/week: 2.0 - 3.0 standard drinks of alcohol    Types: 2 - 3 Glasses of wine per week   Drug use: No   Sexual activity: Not on file  Other Topics Concern   Not on file  Social History Narrative   Not on file   Social Determinants of Health   Financial Resource Strain: Not on file  Food Insecurity: Not on file  Transportation Needs: Not on file  Physical Activity: Not on file  Stress: Not on file  Social Connections: Not on file  Intimate Partner Violence: Not on file    Family History:    Family History  Problem Relation Age of Onset   COPD Mother    Heart attack Father      ROS:  Please see the history of present illness.   All other ROS reviewed and negative.     Physical Exam/Data:   Vitals:   01/28/23 1522 01/28/23 1556 01/28/23 1615 01/28/23 1630  BP: (!) 159/97 (!) 153/88 135/84 (!) 152/94  Pulse: 62 60 (!) 58 68  Resp: 14 15 16  17   Temp: 98.5 F (36.9 C) 97.8 F (36.6 C)    TempSrc: Oral Oral    SpO2: 99% 100% 100% 100%  Weight:      Height:       No intake or output data in the 24 hours ending 01/28/23 1704    01/28/2023    3:19 PM 01/05/2023    8:26 AM 11/05/2022    2:31 PM  Last 3 Weights  Weight (lbs) 130 lb 130 lb 6.4 oz 129 lb 3.2 oz  Weight (kg) 58.968 kg 59.149 kg 58.605 kg     Body mass index is 20.36 kg/m.  General:  Well nourished, well developed, in no acute distress HEENT: normal Neck: no JVD Vascular: No carotid bruits; Distal pulses 2+ bilaterally Cardiac:  normal S1, S2; RRR; soft systolic murmur radiating to L ax line  Lungs:  clear to auscultation bilaterally, no wheezing, rhonchi or rales  Abd: soft, nontender, no hepatomegaly  Ext: no edema Musculoskeletal:  No deformities, BUE and BLE strength normal and equal Skin: warm and dry  Neuro:  CNs 2-12 intact, no focal abnormalities noted Psych:  Normal affect   EKG:  The EKG was personally reviewed and demonstrates:   Telemetry:  Telemetry was personally reviewed and demonstrates:    Relevant CV Studies:   Laboratory Data:  High Sensitivity Troponin:   Recent Labs  Lab 01/28/23 1525  TROPONINIHS 4     Chemistry Recent Labs  Lab 01/28/23 1525  NA 135  K 4.0  CL 104  CO2 21*  GLUCOSE 95  BUN 20  CREATININE 0.90  CALCIUM 8.7*  GFRNONAA >60  ANIONGAP 10    No results for input(s): "PROT", "ALBUMIN", "AST", "ALT", "ALKPHOS", "BILITOT" in the last 168 hours. Lipids No results for input(s): "CHOL", "TRIG", "HDL", "LABVLDL", "LDLCALC", "CHOLHDL" in the last 168 hours.  Hematology Recent Labs  Lab 01/28/23 1525  WBC 4.2  RBC 4.08  HGB 12.6  HCT 38.1  MCV 93.4  MCH 30.9  MCHC 33.1  RDW 13.3  PLT 215   Thyroid No results for input(s): "TSH", "FREET4" in the last 168 hours.  BNPNo results for input(s): "BNP", "PROBNP" in the last 168 hours.  DDimer No results for input(s): "DDIMER" in the last 168  hours.   Radiology/Studies:  DG Chest Portable 1 View  Result Date: 01/28/2023 CLINICAL DATA:  No history is provided. EXAM: PORTABLE CHEST 1 VIEW COMPARISON:  None Available. FINDINGS: The heart size and mediastinal contours are within normal limits. Both lungs are clear. The visualized skeletal structures are unremarkable. IMPRESSION: No active pulmonary disease. Electronically Signed   By: Lorenza Cambridge M.D.   On: 01/28/2023 16:13     Assessment and Plan:   Atypical CP: Patient presents with episodes of piercing left-sided chest pain.  These pains last for several minutes.  She has had 2 episodes today.  There was no associated shortness of breath.  There is no sweats.  They were concerning and she presented to the emergency room.  She has had atypical chest pain in the past and had a coronary CT scan 2 months ago which revealed mild nonobstructive coronary artery disease.  Her initial troponin is negative.  At this time I suspect that her chest pain is noncardiac and that she can be safely discharged from the emergency room if the 2nd troponin is normal .  I would like for her to have her second troponin which is already scheduled.     2.  Mitral regurgitation: trivial MVP .  trivial MR on echo ( not mentioned in report )  following   Risk Assessment/Risk Scores:          [facility] will sign off.   Medication Recommendations:   Other recommendations (labs, testing, etc):   Follow up as an outpatient:  with Dr. Mayford Knife   For questions or updates, please contact [facility] Please consult [email or web address] for  contact info under    Signed, Kristeen Miss, MD  01/28/2023 5:04 PM

## 2023-01-28 NOTE — ED Triage Notes (Signed)
PT BIB [facility] from home due to chest pain.  Pt started to have left sided upper ribcage when shopping at [facility] that was 8/10.  Pt reports it was "stabbing pain."  Pt did go home and take 324 aspirin.  Hx HTN.  EMS gave 0.4 nitro en route.  VS BP 124/70. 18g left forearm.

## 2023-02-22 ENCOUNTER — Ambulatory Visit: Payer: Medicare Other | Attending: Cardiovascular Disease | Admitting: Pharmacist

## 2023-02-22 DIAGNOSIS — E7800 Pure hypercholesterolemia, unspecified: Secondary | ICD-10-CM | POA: Diagnosis not present

## 2023-02-22 DIAGNOSIS — I2510 Atherosclerotic heart disease of native coronary artery without angina pectoris: Secondary | ICD-10-CM

## 2023-02-22 NOTE — Patient Instructions (Addendum)
Your LDL cholesterol is 87 and your goal is < 70  Continue taking your atorvastatin 20mg  daily  Options to try: Lower dose of ezetimibe (Zetia) 5mg  daily - this would lower your LDL by about 20% you experienced cramping in your calves on the 10mg  dose, but some patients tolerate a lower dose better. This medication is generic and covered very well by insurance. Repatha - twice monthly subcutaneous injection that lowers your LDL cholesterol by 60%. You have not taken a medication that works like this before and it's typically well tolerated. Patients who took this medication in clinical trials also had a lower rate of having heart attacks and strokes, and the medication helps with plaque regression. It's a branded medication so the cost is $45/1 month supply or $90/3 month supply  Call Patricia [name], PharmD in clinic if you would like to try one of these medications [phone]

## 2023-02-22 NOTE — Progress Notes (Signed)
Patient ID: Patricia [name]                 DOB: [birth year]                    MRN: [id number]     HPI: Patricia [name] is a 72 y.o. female patient referred to lipid clinic by Dr Mayford Knife. PMH is significant for 03/29/19 calcium score of 182 (84th percentile), coronary CTA 11/17/22 checked secondary to chest pain with increased calcium score of 312 (84th percentile) and aortic atherosclerosis, HTN, and HLD. She reported cramping in her feet on rosuvastatin visit with MD 06/2021 and was changed back to atorvastatin. Follow up LDL was 83 and she was started on ezetimibe. Pt discontinued ezetimibe secondary to leg cramping and was referred to lipid clinic.   I saw pt in March 2023 where she was agreeable to increase the dose of her atorvastatin. She sent a MyChart message a month later reporting constipation, worsening sleep, decreased energy and muscle strength, bloating, and worsening memory. Her atorvastatin was decreased back to 20mg  daily and she declined additional medication like PCSK9i or Nexletol.    Pt has previously experienced cramping in her feet on rosuvastatin monotherapy, and cramping in her calves on ezetimibe when it was added to her atorvastatin. Cramping took about 3 weeks to appear and resolved almost immediately once the medication was stopped.   Today, pt reports tolerating 20mg  of her atorvastatin well. Reviewed results of her coronary CTA in detail, cholesterol's role in CAD progression, and risk factor modification.  Current Medications: atorvastatin 20mg  daily  Intolerances:  Rosuvastatin 5mg  daily - cramping in her feet Atorvastatin 40mg  daily - constipation, worsening sleep, decreased energy and muscle strength, bloating, and worsening memory Ezetimibe 10mg  daily - calf cramping  Risk Factors: elevated calcium score, age, HTN, FHx CAD  LDL goal: 70mg /dL   Diet: Chicken, veggies, fruit.    Exercise: exercises 3-4x weekly with a trainer   Family History: COPD in her mother;  Heart attack in her father.   Social History: 2-3 glasses of wine per week, no drug or tobacco use.   Labs: 11/05/22: TC 199, TG 74, HDL 99, LDL 87 (atorvastatin 20mg  daily) 10/28/21: TC 179, TG 66, HDL 84, LDL 83 (atorvastatin 20mg  daily) 08/21/21: TC 191, TG 81, HDL 94, LDL 83 (atorvastatin 20mg  daily)  Past Medical History:  Diagnosis Date   Benign essential HTN 09/25/2014   CAD (coronary artery disease), native coronary artery    oronary CTA which demonstrated aortic atherosclerosis with coronary Ca score of 312 and minimal <25% plaque in the pLAD (positive remodeling)/LCx and RCA in 10/2022   MR (mitral regurgitation)    mild by echo 2014 with no evidence of MVP   Osteoporosis    tx w fosomax X5 years and boniva X2 currently not on treatment   Patent foramen ovale    Skin cancer 2002   skin cancer basal cell on neck    Current Outpatient Medications on File Prior to Visit  Medication Sig Dispense Refill   acetaminophen (TYLENOL) 325 MG tablet Take 650 mg by mouth every 6 (six) hours as needed for mild pain or headache.     alendronate (FOSAMAX) 70 MG tablet Take 70 mg by mouth once a week. Take with a full glass of water on an empty stomach. (Patient not taking: Reported on 01/05/2023)     amLODipine (NORVASC) 2.5 MG tablet Take 1 tablet (2.5 mg total) by mouth daily. 90  tablet 3   Ascorbic Acid (VITAMIN C) 1000 MG tablet Take 500 mg by mouth daily. (Patient not taking: Reported on 01/05/2023)     aspirin EC 81 MG tablet Take 81 mg by mouth every other day. Bruising on daily dosing (Patient not taking: Reported on 01/05/2023)     atorvastatin (LIPITOR) 20 MG tablet Take 1 tablet (20 mg total) by mouth daily. 30 tablet 5   Biotin 1000 MCG CHEW Take 1 tablet by mouth daily.     CALCIUM CARBONATE-VITAMIN D PO Take 1 tablet by mouth daily.     carboxymethylcellulose (REFRESH PLUS) 0.5 % SOLN Place 1 drop into both eyes daily as needed (dry eyes).     Cholecalciferol 50 MCG (2000 UT) TABS  Take 1 tablet by mouth daily.     fexofenadine (ALLEGRA) 180 MG tablet Take 180 mg by mouth as needed for allergies or rhinitis.     fluticasone (FLONASE) 50 MCG/ACT nasal spray Place into both nostrils as needed for allergies or rhinitis.     losartan (COZAAR) 25 MG tablet TAKE 1 TABLET(25 MG) BY MOUTH DAILY 30 tablet 11   meloxicam (MOBIC) 7.5 MG tablet Take 1 tablet (7.5 mg total) by mouth daily. 30 tablet 0   Menaquinone-7 (VITAMIN K2 PO) Take by mouth. Per patient taking 30 mcg daily     metoprolol tartrate (LOPRESSOR) 50 MG tablet Take one (1) tablet by mouth ( 50 mg) 2 hours prior to CT scan. (Patient not taking: Reported on 01/05/2023) 1 tablet 0   Multiple Vitamin (MULTIVITAMIN) tablet Take 1 tablet by mouth daily.     Omega 3-6-9 Fatty Acids (OMEGA 3-6-9 COMPLEX PO) Take 1 tablet by mouth daily.     sodium chloride (OCEAN) 0.65 % SOLN nasal spray Place 1 spray into both nostrils as needed for congestion.     vitamin B-12 (CYANOCOBALAMIN) 500 MCG tablet Chew 1 tablet by mouth daily.     No current facility-administered medications on file prior to visit.    Allergies  Allergen Reactions   Doxycycline Nausea And Vomiting    Became Dehydrated   Atorvastatin     Constipation, less energy sleep and muscle strength, worse memory, bloating on 40mg  dose. Ok on 20mg      Ezetimibe     Cramping in calves   Rosuvastatin     Cramping in feet    Assessment/Plan:  1. Hyperlipidemia - LDL in the 80s over the past few years on max tolerated statin dose of atorvastatin 20mg  daily. Previously intolerant to rosuvastatin 5mg  daily and ezetimibe 10mg  daily. LDL goal < 70 due to elevated calcium score. Discussed her calcium score results from 2020 and 2024 as well as risk factor modification and the role of cholesterol in CAD progression. Reviewed options for lowering LDL cholesterol, including addition of Repatha 140mg  Q2W ($90/3 month supply) or rechallenge with lower dose of ezetimibe 5mg  daily.  Her insurance does not cover Praluent, Nexletol/Nexlizet, or Leqvio. Pt would like to think about it and I provided her with my direct # in clinic to call if she would like to pursue either option.   Bhavana Kady E. Ozell Juhasz, PharmD, BCACP, CPP [facility] [address], [place], [place] [address] Phone: [phone]; Fax: [phone] 02/22/2023 3:42 PM

## 2023-03-11 ENCOUNTER — Other Ambulatory Visit: Payer: Self-pay

## 2023-03-11 MED ORDER — ATORVASTATIN CALCIUM 20 MG PO TABS: 20.0000 mg | ORAL_TABLET | Freq: Every day | ORAL | 1 refills | Status: DC

## 2023-04-07 DIAGNOSIS — Z872 Personal history of diseases of the skin and subcutaneous tissue: Secondary | ICD-10-CM | POA: Diagnosis not present

## 2023-04-07 DIAGNOSIS — L814 Other melanin hyperpigmentation: Secondary | ICD-10-CM | POA: Diagnosis not present

## 2023-04-07 DIAGNOSIS — D225 Melanocytic nevi of trunk: Secondary | ICD-10-CM | POA: Diagnosis not present

## 2023-04-07 DIAGNOSIS — L821 Other seborrheic keratosis: Secondary | ICD-10-CM | POA: Diagnosis not present

## 2023-04-21 ENCOUNTER — Other Ambulatory Visit: Payer: Self-pay

## 2023-04-21 DIAGNOSIS — I10 Essential (primary) hypertension: Secondary | ICD-10-CM

## 2023-04-21 MED ORDER — LOSARTAN POTASSIUM 25 MG PO TABS
ORAL_TABLET | ORAL | 8 refills | Status: AC
Start: 2023-04-21 — End: ?

## 2023-05-04 DIAGNOSIS — Z01419 Encounter for gynecological examination (general) (routine) without abnormal findings: Secondary | ICD-10-CM | POA: Diagnosis not present

## 2023-05-04 DIAGNOSIS — Z6820 Body mass index (BMI) 20.0-20.9, adult: Secondary | ICD-10-CM | POA: Diagnosis not present

## 2023-05-04 DIAGNOSIS — Z124 Encounter for screening for malignant neoplasm of cervix: Secondary | ICD-10-CM | POA: Diagnosis not present

## 2023-05-04 DIAGNOSIS — Z1151 Encounter for screening for human papillomavirus (HPV): Secondary | ICD-10-CM | POA: Diagnosis not present

## 2023-05-04 DIAGNOSIS — Z1231 Encounter for screening mammogram for malignant neoplasm of breast: Secondary | ICD-10-CM | POA: Diagnosis not present

## 2023-05-13 ENCOUNTER — Other Ambulatory Visit: Payer: Self-pay | Admitting: Internal Medicine

## 2023-05-13 ENCOUNTER — Ambulatory Visit
Admission: RE | Admit: 2023-05-13 | Discharge: 2023-05-13 | Disposition: A | Discharge status: Home / Self Care | Payer: Medicare Other | Source: Ambulatory Visit | Attending: Internal Medicine | Admitting: Internal Medicine

## 2023-05-13 DIAGNOSIS — K30 Functional dyspepsia: Secondary | ICD-10-CM | POA: Diagnosis not present

## 2023-05-13 DIAGNOSIS — K5900 Constipation, unspecified: Secondary | ICD-10-CM | POA: Diagnosis not present

## 2023-05-13 DIAGNOSIS — R109 Unspecified abdominal pain: Secondary | ICD-10-CM

## 2023-05-13 DIAGNOSIS — Z23 Encounter for immunization: Secondary | ICD-10-CM | POA: Diagnosis not present

## 2023-05-14 DIAGNOSIS — K30 Functional dyspepsia: Secondary | ICD-10-CM | POA: Diagnosis not present

## 2023-05-25 DIAGNOSIS — K080 Exfoliation of teeth due to systemic causes: Secondary | ICD-10-CM | POA: Diagnosis not present

## 2023-07-06 ENCOUNTER — Other Ambulatory Visit: Payer: Self-pay

## 2023-07-06 MED ORDER — ATORVASTATIN CALCIUM 20 MG PO TABS: 20.0000 mg | ORAL_TABLET | Freq: Every day | ORAL | 2 refills | Status: DC

## 2023-07-13 DIAGNOSIS — R531 Weakness: Secondary | ICD-10-CM | POA: Diagnosis not present

## 2023-07-13 DIAGNOSIS — M25562 Pain in left knee: Secondary | ICD-10-CM | POA: Diagnosis not present

## 2023-07-19 DIAGNOSIS — R531 Weakness: Secondary | ICD-10-CM | POA: Diagnosis not present

## 2023-07-19 DIAGNOSIS — M25562 Pain in left knee: Secondary | ICD-10-CM | POA: Diagnosis not present

## 2023-07-26 DIAGNOSIS — M25562 Pain in left knee: Secondary | ICD-10-CM | POA: Diagnosis not present

## 2023-07-26 DIAGNOSIS — R531 Weakness: Secondary | ICD-10-CM | POA: Diagnosis not present

## 2023-08-04 DIAGNOSIS — M25562 Pain in left knee: Secondary | ICD-10-CM | POA: Diagnosis not present

## 2023-08-26 DIAGNOSIS — M6281 Muscle weakness (generalized): Secondary | ICD-10-CM | POA: Diagnosis not present

## 2023-08-26 DIAGNOSIS — M25562 Pain in left knee: Secondary | ICD-10-CM | POA: Diagnosis not present

## 2023-09-01 DIAGNOSIS — M25562 Pain in left knee: Secondary | ICD-10-CM | POA: Diagnosis not present

## 2023-09-01 DIAGNOSIS — M6281 Muscle weakness (generalized): Secondary | ICD-10-CM | POA: Diagnosis not present

## 2023-09-07 DIAGNOSIS — M6281 Muscle weakness (generalized): Secondary | ICD-10-CM | POA: Diagnosis not present

## 2023-09-07 DIAGNOSIS — M25562 Pain in left knee: Secondary | ICD-10-CM | POA: Diagnosis not present

## 2023-09-15 DIAGNOSIS — U071 COVID-19: Secondary | ICD-10-CM | POA: Diagnosis not present

## 2023-09-15 DIAGNOSIS — J069 Acute upper respiratory infection, unspecified: Secondary | ICD-10-CM | POA: Diagnosis not present

## 2023-09-15 DIAGNOSIS — J029 Acute pharyngitis, unspecified: Secondary | ICD-10-CM | POA: Diagnosis not present

## 2023-09-21 DIAGNOSIS — M6281 Muscle weakness (generalized): Secondary | ICD-10-CM | POA: Diagnosis not present

## 2023-09-21 DIAGNOSIS — M25562 Pain in left knee: Secondary | ICD-10-CM | POA: Diagnosis not present

## 2023-09-28 DIAGNOSIS — M25562 Pain in left knee: Secondary | ICD-10-CM | POA: Diagnosis not present

## 2023-09-28 DIAGNOSIS — M6281 Muscle weakness (generalized): Secondary | ICD-10-CM | POA: Diagnosis not present

## 2023-10-04 ENCOUNTER — Ambulatory Visit (INDEPENDENT_AMBULATORY_CARE_PROVIDER_SITE_OTHER): Payer: Medicare Other

## 2023-10-04 ENCOUNTER — Ambulatory Visit: Payer: Medicare Other | Admitting: Podiatry

## 2023-10-04 DIAGNOSIS — M778 Other enthesopathies, not elsewhere classified: Secondary | ICD-10-CM | POA: Diagnosis not present

## 2023-10-04 DIAGNOSIS — D2372 Other benign neoplasm of skin of left lower limb, including hip: Secondary | ICD-10-CM

## 2023-10-04 DIAGNOSIS — M216X2 Other acquired deformities of left foot: Secondary | ICD-10-CM

## 2023-10-04 NOTE — Patient Instructions (Signed)
 Take dressing off in 8 hours and wash the foot with soap and water. If it is hurting or becomes uncomfortable before the 8 hours, go ahead and remove the bandage and wash the area.  If it blisters, apply antibiotic ointment and a band-aid.  Monitor for any signs/symptoms of infection. Call the office immediately if any occur or go directly to the emergency room. Call with any questions/concerns.

## 2023-10-04 NOTE — Progress Notes (Signed)
 Subjective: Chief Complaint  Patient presents with   Foot Pain    RM#11 Left foot pain X 6-7 months on bottom of foot a place bulging on the side of foot.   73 year old female presents the office today with concerns of left foot pain.  She has a new spot submetatarsal 2 that she points to as well.  No injuries or changes otherwise that she notes.  Objective: AAO x3, NAD DP/PT pulses palpable bilaterally, CRT less than 3 seconds Prominent metatarsal heads plantarly with..  Punctate annular hyperkeratotic lesions which are uniform in color submetatarsal 2 and 5.  There is no underlying ulceration, drainage or signs of infection. No pain with calf compression, swelling, warmth, erythema  Assessment: Skin lesion left foot  Plan: -All treatment options discussed with the patient including all alternatives, risks, complications.  -X-rays obtained reviewed left foot.  3 views were obtained.  No evidence of acute fracture.  No calcifications or foreign body. -Sharply debrided lesions x 2 without any complications or bleeding.  I cleaned the skin with alcohol.  Cantharone was applied followed by occlusive bandage.  Postprocedure instructions discussed. -Monitor for any clinical signs or symptoms of infection and directed to call the office immediately should any occur or go to the ER. -Patient encouraged to call the office with any questions, concerns, change in symptoms.   Return if symptoms worsen or fail to improve.  Charity Conch DPM

## 2023-10-05 DIAGNOSIS — M6281 Muscle weakness (generalized): Secondary | ICD-10-CM | POA: Diagnosis not present

## 2023-10-05 DIAGNOSIS — M25562 Pain in left knee: Secondary | ICD-10-CM | POA: Diagnosis not present

## 2023-10-11 DIAGNOSIS — L814 Other melanin hyperpigmentation: Secondary | ICD-10-CM | POA: Diagnosis not present

## 2023-10-11 DIAGNOSIS — L578 Other skin changes due to chronic exposure to nonionizing radiation: Secondary | ICD-10-CM | POA: Diagnosis not present

## 2023-10-11 DIAGNOSIS — Z08 Encounter for follow-up examination after completed treatment for malignant neoplasm: Secondary | ICD-10-CM | POA: Diagnosis not present

## 2023-10-11 DIAGNOSIS — Z872 Personal history of diseases of the skin and subcutaneous tissue: Secondary | ICD-10-CM | POA: Diagnosis not present

## 2023-11-06 ENCOUNTER — Encounter (HOSPITAL_COMMUNITY): Payer: Self-pay

## 2023-11-06 ENCOUNTER — Ambulatory Visit (INDEPENDENT_AMBULATORY_CARE_PROVIDER_SITE_OTHER)

## 2023-11-06 ENCOUNTER — Ambulatory Visit (HOSPITAL_COMMUNITY)
Admission: EM | Admit: 2023-11-06 | Discharge: 2023-11-06 | Disposition: A | Discharge status: Home / Self Care | Attending: Emergency Medicine | Admitting: Emergency Medicine

## 2023-11-06 DIAGNOSIS — J0100 Acute maxillary sinusitis, unspecified: Secondary | ICD-10-CM | POA: Diagnosis not present

## 2023-11-06 DIAGNOSIS — R051 Acute cough: Secondary | ICD-10-CM | POA: Diagnosis not present

## 2023-11-06 DIAGNOSIS — R0989 Other specified symptoms and signs involving the circulatory and respiratory systems: Secondary | ICD-10-CM | POA: Diagnosis not present

## 2023-11-06 DIAGNOSIS — R059 Cough, unspecified: Secondary | ICD-10-CM | POA: Diagnosis not present

## 2023-11-06 DIAGNOSIS — R519 Headache, unspecified: Secondary | ICD-10-CM | POA: Diagnosis not present

## 2023-11-06 DIAGNOSIS — R509 Fever, unspecified: Secondary | ICD-10-CM | POA: Diagnosis not present

## 2023-11-06 MED ORDER — PREDNISONE 20 MG PO TABS: 40.0000 mg | ORAL_TABLET | Freq: Every day | ORAL | 0 refills | Status: AC

## 2023-11-06 MED ORDER — AMOXICILLIN-POT CLAVULANATE 875-125 MG PO TABS: 1.0000 | ORAL_TABLET | Freq: Two times a day (BID) | ORAL | 0 refills | Status: DC

## 2023-11-06 MED ORDER — IPRATROPIUM-ALBUTEROL 0.5-2.5 (3) MG/3ML IN SOLN
RESPIRATORY_TRACT | Status: AC
  Filled 2023-11-06: qty 3

## 2023-11-06 MED ORDER — IPRATROPIUM-ALBUTEROL 0.5-2.5 (3) MG/3ML IN SOLN
3.0000 mL | Freq: Once | RESPIRATORY_TRACT | Status: AC
  Administered 2023-11-06: 3 mL via RESPIRATORY_TRACT

## 2023-11-06 NOTE — Discharge Instructions (Signed)
 Your chest x-ray did not reveal any pneumonia today.  Start taking Augmentin twice daily for 7 days for sinus infection.  I have also prescribed a short course of steroids for you to take once daily over the next 5 days to assist with inflammation in your lungs as well as assist with your sinusitis related symptoms.  Otherwise recommend taking Mucinex as needed for cough and congestion.  You can take Tylenol as needed for any pain.  Make sure you are staying hydrated and getting plenty of rest.  Return here for symptoms persist.

## 2023-11-06 NOTE — ED Triage Notes (Signed)
 Patient here today with c/o fever, headache, nasal congestion, ST, nausea, burning in chest, and cough X 1 weeks. Patient states that she has been having some allergy symptoms for the past 2 weeks. She tried using Flonase with some relief in the beginning with her allergies. No known sick contacts.

## 2023-11-06 NOTE — ED Provider Notes (Signed)
 [facility]    CSN: [id number] Arrival date & time: 11/06/23  1629      History   Chief Complaint Chief Complaint  Patient presents with   Cough    HPI Patricia [name] is a 73 y.o. female.   Patient presents with fever, headache, nasal congestion, sinus pain/pressure, sore throat, mild nausea, and cough x 1 week.  Patient states that today she began to have some chest congestion and a burning sensation in her chest.  Denies shortness of breath, chest pain, abdominal pain, vomiting, and diarrhea.  Denies history of asthma or COPD.   Cough   Past Medical History:  Diagnosis Date   Benign essential HTN 09/25/2014   CAD (coronary artery disease), native coronary artery    oronary CTA which demonstrated aortic atherosclerosis with coronary Ca score of 312 and minimal <25% plaque in the pLAD (positive remodeling)/LCx and RCA in 10/2022   MR (mitral regurgitation)    mild by echo 2014 with no evidence of MVP   Osteoporosis    tx w fosomax X5 years and boniva X2 currently not on treatment   Patent foramen ovale    Skin cancer 2002   skin cancer basal cell on neck    Patient Active Problem List   Diagnosis Date Noted   CAD (coronary artery disease), native coronary artery 04/20/2019   Hyperlipidemia 03/01/2019   Chest pain 03/01/2019   Benign essential HTN 09/25/2014   MR (mitral regurgitation)    Palpitations 08/31/2013   PVC's (premature ventricular contractions) 08/31/2013    Past Surgical History:  Procedure Laterality Date   none      OB History   No obstetric history on file.      Home Medications    Prior to Admission medications   Medication Sig Start Date End Date Taking? Authorizing Provider  amoxicillin-clavulanate (AUGMENTIN) 875-125 MG tablet Take 1 tablet by mouth every 12 (twelve) hours. 11/06/23  Yes Susann Givens, Kamdyn Colborn A, NP  predniSONE (DELTASONE) 20 MG tablet Take 2 tablets (40 mg total) by mouth daily for 5 days. 11/06/23 11/11/23 Yes  Wynonia Lawman A, NP  acetaminophen (TYLENOL) 325 MG tablet Take 650 mg by mouth every 6 (six) hours as needed for mild pain or headache.    [provider]  amLODipine (NORVASC) 2.5 MG tablet Take 1 tablet (2.5 mg total) by mouth daily. 12/04/22   Swinyer, Zachary George, NP  Ascorbic Acid (VITAMIN C) 1000 MG tablet Take 500 mg by mouth daily.    [provider]  aspirin EC 81 MG tablet Take 81 mg by mouth every other day. Bruising on daily dosing    [provider]  atorvastatin (LIPITOR) 20 MG tablet Take 1 tablet (20 mg total) by mouth daily. 07/06/23   Quintella Reichert, MD  CALCIUM CARBONATE-VITAMIN D PO Take 1 tablet by mouth daily.    [provider]  carboxymethylcellulose (REFRESH PLUS) 0.5 % SOLN Place 1 drop into both eyes daily as needed (dry eyes).    [provider]  Cholecalciferol 50 MCG (2000 UT) TABS Take 1 tablet by mouth daily.    [provider]  fexofenadine (ALLEGRA) 180 MG tablet Take 180 mg by mouth as needed for allergies or rhinitis.    [provider]  fluticasone (FLONASE) 50 MCG/ACT nasal spray Place into both nostrils as needed for allergies or rhinitis.    [provider]  losartan (COZAAR) 25 MG tablet TAKE 1 TABLET(25 MG) BY MOUTH DAILY  04/21/23   Quintella Reichert, MD  Menaquinone-7 (VITAMIN K2 PO) Take by mouth. Per patient taking 30 mcg daily    [provider]  Multiple Vitamin (MULTIVITAMIN) tablet Take 1 tablet by mouth daily.    [provider]  Omega 3-6-9 Fatty Acids (OMEGA 3-6-9 COMPLEX PO) Take 1 tablet by mouth daily.    [provider]  sodium chloride (OCEAN) 0.65 % SOLN nasal spray Place 1 spray into both nostrils as needed for congestion.    [provider]  vitamin B-12 (CYANOCOBALAMIN) 500 MCG tablet Chew 1 tablet by mouth daily.    [provider]    Family History Family History  Problem Relation Age of Onset   COPD Mother    Heart  attack Father     Social History Social History   Tobacco Use   Smoking status: Never   Smokeless tobacco: Never  Substance Use Topics   Alcohol use: Yes    Alcohol/week: 2.0 - 3.0 standard drinks of alcohol    Types: 2 - 3 Glasses of wine per week   Drug use: No     Allergies   Doxycycline, Atorvastatin, Ezetimibe, and Rosuvastatin   Review of Systems Review of Systems  Respiratory:  Positive for cough.    Per HPI  Physical Exam Triage Vital Signs ED Triage Vitals  Encounter Vitals Group     BP 11/06/23 1746 (!) 152/92     Systolic BP Percentile --      Diastolic BP Percentile --      Pulse Rate 11/06/23 1746 80     Resp 11/06/23 1746 16     Temp 11/06/23 1746 98.7 F (37.1 C)     Temp Source 11/06/23 1746 Oral     SpO2 11/06/23 1746 98 %     Weight --      Height --      Head Circumference --      Peak Flow --      Pain Score 11/06/23 1751 7     Pain Loc --      Pain Education --      Exclude from Growth Chart --    No data found.  Updated Vital Signs BP (!) 152/92 (BP Location: Left Arm)   Pulse 80   Temp 98.7 F (37.1 C) (Oral)   Resp 16   SpO2 98%   Visual Acuity Right Eye Distance:   Left Eye Distance:   Bilateral Distance:    Right Eye Near:   Left Eye Near:    Bilateral Near:     Physical Exam Vitals and nursing note reviewed.  Constitutional:      General: She is awake. She is not in acute distress.    Appearance: Normal appearance. She is well-developed and well-groomed. She is not ill-appearing.  HENT:     Right Ear: Tympanic membrane, ear canal and external ear normal.     Left Ear: Tympanic membrane, ear canal and external ear normal.     Nose: Congestion and rhinorrhea present.     Right Sinus: Maxillary sinus tenderness present.     Left Sinus: Maxillary sinus tenderness present.     Mouth/Throat:     Mouth: Mucous membranes are moist.     Pharynx: Posterior oropharyngeal erythema present. No oropharyngeal exudate.   Cardiovascular:     Rate and Rhythm: Normal rate and regular rhythm.  Pulmonary:     Effort: Pulmonary effort is normal.  Breath sounds: Rhonchi present.  Skin:    General: Skin is warm and dry.  Neurological:     Mental Status: She is alert.  Psychiatric:        Behavior: Behavior is cooperative.      UC Treatments / Results  Labs (all labs ordered are listed, but only abnormal results are displayed) Labs Reviewed - No data to display  EKG   Radiology DG Chest 2 View Result Date: 11/06/2023 CLINICAL DATA:  Fever, headache, congestion and cough EXAM: CHEST - 2 VIEW COMPARISON:  01/28/2023 FINDINGS: The heart size and mediastinal contours are within normal limits. Both lungs are clear. The visualized skeletal structures are unremarkable. IMPRESSION: No active cardiopulmonary disease. Electronically Signed   By: Sharlet Salina M.D.   On: 11/06/2023 18:21    Procedures Procedures (including critical care time)  Medications Ordered in UC Medications  ipratropium-albuterol (DUONEB) 0.5-2.5 (3) MG/3ML nebulizer solution 3 mL (3 mLs Nebulization Given 11/06/23 1813)    Initial Impression / Assessment and Plan / UC Course  I have reviewed the triage vital signs and the nursing notes.  Pertinent labs & imaging results that were available during my care of the patient were reviewed by me and considered in my medical decision making (see chart for details).     Upon assessment congestion and rhinorrhea are present, mild erythema noted to pharynx.  Bilateral maxillary sinus tenderness noted.  Expiratory rhonchi noted throughout all lung fields.  Given DuoNeb with relief of rhonchi.  Based on my interpretation chest x-ray did not reveal any active cardiopulmonary disease at this time.  Radiology report confirms this.  Prescribed Augmentin for sinusitis.  Prescribed short course of prednisone to assist with possible inflammation in the lungs leading to rhonchi.  Discussed  over-the-counter medication for symptoms.  Discussed return precautions. Final Clinical Impressions(s) / UC Diagnoses   Final diagnoses:  Acute cough  Acute non-recurrent maxillary sinusitis     Discharge Instructions      Your chest x-ray did not reveal any pneumonia today.  Start taking Augmentin twice daily for 7 days for sinus infection.  I have also prescribed a short course of steroids for you to take once daily over the next 5 days to assist with inflammation in your lungs as well as assist with your sinusitis related symptoms.  Otherwise recommend taking Mucinex as needed for cough and congestion.  You can take Tylenol as needed for any pain.  Make sure you are staying hydrated and getting plenty of rest.  Return here for symptoms persist.    ED Prescriptions     Medication Sig Dispense Auth. Provider   amoxicillin-clavulanate (AUGMENTIN) 875-125 MG tablet Take 1 tablet by mouth every 12 (twelve) hours. 14 tablet Susann Givens, Yocheved Depner A, NP   predniSONE (DELTASONE) 20 MG tablet Take 2 tablets (40 mg total) by mouth daily for 5 days. 10 tablet Wynonia Lawman A, NP      PDMP not reviewed this encounter.   Wynonia Lawman A, NP 11/06/23 [phone]

## 2023-11-10 DIAGNOSIS — D492 Neoplasm of unspecified behavior of bone, soft tissue, and skin: Secondary | ICD-10-CM | POA: Diagnosis not present

## 2023-11-10 DIAGNOSIS — L538 Other specified erythematous conditions: Secondary | ICD-10-CM | POA: Diagnosis not present

## 2023-11-10 DIAGNOSIS — L821 Other seborrheic keratosis: Secondary | ICD-10-CM | POA: Diagnosis not present

## 2023-11-24 DIAGNOSIS — K222 Esophageal obstruction: Secondary | ICD-10-CM | POA: Diagnosis not present

## 2023-11-24 DIAGNOSIS — Z0000 Encounter for general adult medical examination without abnormal findings: Secondary | ICD-10-CM | POA: Diagnosis not present

## 2023-11-24 DIAGNOSIS — I2510 Atherosclerotic heart disease of native coronary artery without angina pectoris: Secondary | ICD-10-CM | POA: Diagnosis not present

## 2023-11-24 DIAGNOSIS — Z23 Encounter for immunization: Secondary | ICD-10-CM | POA: Diagnosis not present

## 2023-11-26 DIAGNOSIS — I10 Essential (primary) hypertension: Secondary | ICD-10-CM | POA: Diagnosis not present

## 2023-11-26 DIAGNOSIS — E7800 Pure hypercholesterolemia, unspecified: Secondary | ICD-10-CM | POA: Diagnosis not present

## 2023-11-26 DIAGNOSIS — Z5181 Encounter for therapeutic drug level monitoring: Secondary | ICD-10-CM | POA: Diagnosis not present

## 2023-12-27 DIAGNOSIS — H18513 Endothelial corneal dystrophy, bilateral: Secondary | ICD-10-CM | POA: Diagnosis not present

## 2023-12-27 DIAGNOSIS — H2513 Age-related nuclear cataract, bilateral: Secondary | ICD-10-CM | POA: Diagnosis not present

## 2024-01-09 ENCOUNTER — Other Ambulatory Visit: Payer: Self-pay | Admitting: Nurse Practitioner

## 2024-01-14 ENCOUNTER — Encounter: Payer: Self-pay | Admitting: Cardiology

## 2024-01-14 ENCOUNTER — Ambulatory Visit: Attending: Cardiology | Admitting: Cardiology

## 2024-01-14 VITALS — BP 126/78 | HR 72 | Ht 67.0 in | Wt 127.2 lb

## 2024-01-14 DIAGNOSIS — I2510 Atherosclerotic heart disease of native coronary artery without angina pectoris: Secondary | ICD-10-CM

## 2024-01-14 DIAGNOSIS — E7800 Pure hypercholesterolemia, unspecified: Secondary | ICD-10-CM | POA: Diagnosis not present

## 2024-01-14 DIAGNOSIS — I10 Essential (primary) hypertension: Secondary | ICD-10-CM | POA: Diagnosis not present

## 2024-01-14 NOTE — Patient Instructions (Signed)

## 2024-01-14 NOTE — Progress Notes (Signed)
 Cardiology Office Note:    Date:  01/14/2024   ID:  Abiola Behring, DOB [birth year], MRN [id number]  PCP:  Merl Star, MD  Cardiologist:  Gaylyn Keas, MD    Referring MD: Merl Star, MD   Chief Complaint  Patient presents with   Coronary Artery Disease   Hypertension   Hyperlipidemia     History of Present Illness:    Patricia [name] is a 73 y.o. female with a hx of coronary artery calcifications by Chest CT with a Ca score of 182 placing her in the 84th percentile for age and sex matched controls. She has not had a stress test since she has been asymptomatic.  She also has HTN and HLD. She was started on ASA 81mg  daily and Atorvastatin  10mg  daily due to LDL 140.    She ultimately underwent coronary CTA for chest pain which demonstrated aortic atherosclerosis with coronary Ca score of 312 and minimal <25% plaque in the pLAD (positive remodeling)/LCx and RCA. 2D echo was normal as well 11/2022.  She is here today for followup and is doing well.  She denies any chest pain or pressure, SOB, DOE (except when doing her exercise class), PND, orthopnea, LE edema, dizziness or syncope. She occasionally feels a skipped beat lasting 12 seconds in the am but are not bothersome.  She is compliant with her meds and is tolerating meds with no SE.    Past Medical History:  Diagnosis Date   Benign essential HTN 09/25/2014   CAD (coronary artery disease), native coronary artery    oronary CTA which demonstrated aortic atherosclerosis with coronary Ca score of 312 and minimal <25% plaque in the pLAD (positive remodeling)/LCx and RCA in 10/2022   MR (mitral regurgitation)    mild by echo 2014 with no evidence of MVP   Osteoporosis    tx w fosomax X5 years and boniva X2 currently not on treatment   Patent foramen ovale    Skin cancer 2002   skin cancer basal cell on neck    Past Surgical History:  Procedure Laterality Date   none      Current Medications: Current Meds  Medication Sig    acetaminophen (TYLENOL) 325 MG tablet Take 650 mg by mouth every 6 (six) hours as needed for mild pain or headache.   amLODipine  (NORVASC ) 2.5 MG tablet TAKE 1 TABLET(2.5 MG) BY MOUTH DAILY   Ascorbic Acid (VITAMIN C) 1000 MG tablet Take 500 mg by mouth daily.   aspirin  EC 81 MG tablet Take 81 mg by mouth every other day. Bruising on daily dosing   atorvastatin  (LIPITOR) 20 MG tablet Take 1 tablet (20 mg total) by mouth daily.   CALCIUM  CARBONATE-VITAMIN D PO Take 1 tablet by mouth daily.   carboxymethylcellulose (REFRESH PLUS) 0.5 % SOLN Place 1 drop into both eyes daily as needed (dry eyes).   Cholecalciferol 50 MCG (2000 UT) TABS Take 1 tablet by mouth daily.   fexofenadine (ALLEGRA) 180 MG tablet Take 180 mg by mouth as needed for allergies or rhinitis.   fluticasone (FLONASE) 50 MCG/ACT nasal spray Place into both nostrils as needed for allergies or rhinitis.   losartan  (COZAAR ) 25 MG tablet TAKE 1 TABLET(25 MG) BY MOUTH DAILY   Menaquinone-7 (VITAMIN K2 PO) Take by mouth. Per patient taking 30 mcg daily   Multiple Vitamin (MULTIVITAMIN) tablet Take 1 tablet by mouth daily.   Omega 3-6-9 Fatty Acids (OMEGA 3-6-9 COMPLEX PO) Take 1 tablet by mouth daily.  sodium chloride  (OCEAN) 0.65 % SOLN nasal spray Place 1 spray into both nostrils as needed for congestion.   vitamin B-12 (CYANOCOBALAMIN ) 500 MCG tablet Chew 1 tablet by mouth daily.     Allergies:   Doxycycline, Atorvastatin , Ezetimibe , and Rosuvastatin   Social History   Socioeconomic History   Marital status: Married    Spouse name: Not on file   Number of children: Not on file   Years of education: Not on file   Highest education level: Not on file  Occupational History   Not on file  Tobacco Use   Smoking status: Never   Smokeless tobacco: Never  Substance and Sexual Activity   Alcohol use: Yes    Alcohol/week: 2.0 - 3.0 standard drinks of alcohol    Types: 2 - 3 Glasses of wine per week   Drug use: No   Sexual  activity: Not on file  Other Topics Concern   Not on file  Social History Narrative   Not on file   Social Drivers of Health   Financial Resource Strain: Not on file  Food Insecurity: Not on file  Transportation Needs: Not on file  Physical Activity: Not on file  Stress: Not on file  Social Connections: Not on file     Family History: The patient's  family history includes COPD in her mother; Heart attack in her father.  ROS:   Please see the history of present illness.    ROS  All other systems reviewed and negative.   EKGs/Labs/Other Studies Reviewed:    The following studies were reviewed today:  EKG Interpretation Date/Time:  Friday Jan 14 2024 11:21:08 EDT Ventricular Rate:  72 PR Interval:  154 QRS Duration:  102 QT Interval:  376 QTC Calculation: 411 R Axis:   238  Text Interpretation: Normal sinus rhythm Right superior axis deviation Pulmonary disease pattern Incomplete right bundle branch block Right ventricular hypertrophy When compared with ECG of 28-Jan-2023 15:23, PREVIOUS ECG IS PRESENT Confirmed by Gaylyn Keas ([id number]) on 01/14/2024 11:30:23 AM's   Recent Labs: 01/28/2023: BUN 20; Creatinine, Ser 0.90; Hemoglobin 12.6; Platelets 215; Potassium 4.0; Sodium 135   Recent Lipid Panel    Component Value Date/Time   CHOL 179 10/28/2021 0722   TRIG 66 10/28/2021 0722   HDL 84 10/28/2021 0722   CHOLHDL 2.1 10/28/2021 0722   LDLCALC 83 10/28/2021 0722          Physical Exam:    VS:  BP 126/78 (BP Location: Left Arm, Patient Position: Sitting, Cuff Size: Normal)   Pulse 72   Ht 5\' 7"  (1.702 m)   Wt 127 lb 3.2 oz (57.7 kg)   SpO2 96%   BMI 19.92 kg/m     Wt Readings from Last 3 Encounters:  01/14/24 127 lb 3.2 oz (57.7 kg)  01/28/23 130 lb (59 kg)  01/05/23 130 lb 6.4 oz (59.1 kg)  GEN: Well nourished, well developed in no acute distress HEENT: Normal NECK: No JVD; No carotid bruits LYMPHATICS: No lymphadenopathy CARDIAC:RRR, no murmurs,  rubs, gallops RESPIRATORY:  Clear to auscultation without rales, wheezing or rhonchi  ABDOMEN: Soft, non-tender, non-distended MUSCULOSKELETAL:  No edema; No deformity  SKIN: Warm and dry NEUROLOGIC:  Alert and oriented x 3 PSYCHIATRIC:  Normal affect  ASSESSMENT:    1. Coronary artery disease involving native coronary artery of native heart without angina pectoris   2. Benign essential HTN   3. Pure hypercholesterolemia    PLAN:    In  order of problems listed above:  1. ASCAD -coronary CTA which demonstrated aortic atherosclerosis with coronary Ca score of 312 and minimal <25% plaque in the pLAD (positive remodeling)/LCx and RCA 10/2022 -2D echo with normal LVF 2024 -She denies any anginal symptoms since I saw her last -Continue aspirin  81 mg daily, atorvastatin  20 mg daily with as needed refills  2.  HTN - BP controlled on exam today - Continue amlodipine  2.5 mg daily and losartan  25 mg daily with as needed refills  3.  HLD -LDL goal less than 70 -She was on Atorvastatin  but had an episode of confusion and her husband was concerned it was the statin and her PCP changed her to Crestor.   -She developed foot cramps on Crestor which was stopped and she was placed back on atorvastatin  which she has been tolerating -I have personally reviewed and interpreted outside labs performed by patient's PCP which showed LDL 125 and HDL 88 ALT 16 on 11/26/2023 -Continue atorvastatin  20 mg daily>> will not increase dose of atorvastatin  since she has had issues in the past -Intolerant to Zetia  -She was referred to lipid clinic and insurance would not cover  Praluent, Leqvio or Nexlizet -Recommended that she start on Repatha which was $90 for 41-month supply but she wanted to think about it -I have strongly recommended that she start the Repatha>> she is going to think about it and let me know  Followup with me in 1 year   Medication Adjustments/Labs and Tests Ordered: Current medicines are  reviewed at length with the patient today.  Concerns regarding medicines are outlined above.  Orders Placed This Encounter  Procedures   EKG 12-Lead    No orders of the defined types were placed in this encounter.   Signed, Gaylyn Keas, MD  01/14/2024 11:32 AM    [facility]

## 2024-02-04 ENCOUNTER — Other Ambulatory Visit: Payer: Self-pay | Admitting: Cardiology

## 2024-02-04 DIAGNOSIS — I10 Essential (primary) hypertension: Secondary | ICD-10-CM

## 2024-03-03 DIAGNOSIS — K5900 Constipation, unspecified: Secondary | ICD-10-CM | POA: Diagnosis not present

## 2024-03-03 DIAGNOSIS — R194 Change in bowel habit: Secondary | ICD-10-CM | POA: Diagnosis not present

## 2024-03-18 DIAGNOSIS — Z859 Personal history of malignant neoplasm, unspecified: Secondary | ICD-10-CM | POA: Diagnosis not present

## 2024-03-18 DIAGNOSIS — R0789 Other chest pain: Secondary | ICD-10-CM | POA: Diagnosis not present

## 2024-03-18 DIAGNOSIS — I10 Essential (primary) hypertension: Secondary | ICD-10-CM | POA: Diagnosis not present

## 2024-03-18 DIAGNOSIS — R918 Other nonspecific abnormal finding of lung field: Secondary | ICD-10-CM | POA: Diagnosis not present

## 2024-03-18 DIAGNOSIS — Z743 Need for continuous supervision: Secondary | ICD-10-CM | POA: Diagnosis not present

## 2024-03-18 DIAGNOSIS — M549 Dorsalgia, unspecified: Secondary | ICD-10-CM | POA: Diagnosis not present

## 2024-03-18 DIAGNOSIS — R079 Chest pain, unspecified: Secondary | ICD-10-CM | POA: Diagnosis not present

## 2024-04-07 ENCOUNTER — Other Ambulatory Visit: Payer: Self-pay | Admitting: Cardiology

## 2024-04-10 ENCOUNTER — Other Ambulatory Visit: Payer: Self-pay | Admitting: Cardiology

## 2024-04-10 DIAGNOSIS — L821 Other seborrheic keratosis: Secondary | ICD-10-CM | POA: Diagnosis not present

## 2024-04-10 DIAGNOSIS — D225 Melanocytic nevi of trunk: Secondary | ICD-10-CM | POA: Diagnosis not present

## 2024-04-10 DIAGNOSIS — L814 Other melanin hyperpigmentation: Secondary | ICD-10-CM | POA: Diagnosis not present

## 2024-04-10 DIAGNOSIS — Z872 Personal history of diseases of the skin and subcutaneous tissue: Secondary | ICD-10-CM | POA: Diagnosis not present

## 2024-04-13 DIAGNOSIS — K648 Other hemorrhoids: Secondary | ICD-10-CM | POA: Diagnosis not present

## 2024-04-13 DIAGNOSIS — D120 Benign neoplasm of cecum: Secondary | ICD-10-CM | POA: Diagnosis not present

## 2024-04-13 DIAGNOSIS — Z860101 Personal history of adenomatous and serrated colon polyps: Secondary | ICD-10-CM | POA: Diagnosis not present

## 2024-04-13 DIAGNOSIS — Z09 Encounter for follow-up examination after completed treatment for conditions other than malignant neoplasm: Secondary | ICD-10-CM | POA: Diagnosis not present

## 2024-04-13 DIAGNOSIS — D125 Benign neoplasm of sigmoid colon: Secondary | ICD-10-CM | POA: Diagnosis not present

## 2024-04-13 DIAGNOSIS — D122 Benign neoplasm of ascending colon: Secondary | ICD-10-CM | POA: Diagnosis not present

## 2024-04-17 DIAGNOSIS — D122 Benign neoplasm of ascending colon: Secondary | ICD-10-CM | POA: Diagnosis not present

## 2024-04-17 DIAGNOSIS — D120 Benign neoplasm of cecum: Secondary | ICD-10-CM | POA: Diagnosis not present

## 2024-04-17 DIAGNOSIS — D125 Benign neoplasm of sigmoid colon: Secondary | ICD-10-CM | POA: Diagnosis not present

## 2024-05-11 DIAGNOSIS — Z1231 Encounter for screening mammogram for malignant neoplasm of breast: Secondary | ICD-10-CM | POA: Diagnosis not present

## 2024-05-11 DIAGNOSIS — Z01419 Encounter for gynecological examination (general) (routine) without abnormal findings: Secondary | ICD-10-CM | POA: Diagnosis not present

## 2024-05-11 DIAGNOSIS — Z6820 Body mass index (BMI) 20.0-20.9, adult: Secondary | ICD-10-CM | POA: Diagnosis not present

## 2024-05-17 DIAGNOSIS — J069 Acute upper respiratory infection, unspecified: Secondary | ICD-10-CM | POA: Diagnosis not present

## 2024-05-27 DIAGNOSIS — Z23 Encounter for immunization: Secondary | ICD-10-CM | POA: Diagnosis not present

## 2024-06-01 DIAGNOSIS — K080 Exfoliation of teeth due to systemic causes: Secondary | ICD-10-CM | POA: Diagnosis not present

## 2024-08-03 DIAGNOSIS — I10 Essential (primary) hypertension: Secondary | ICD-10-CM | POA: Diagnosis not present

## 2024-08-03 DIAGNOSIS — R519 Headache, unspecified: Secondary | ICD-10-CM | POA: Diagnosis not present
